# Patient Record
Sex: Female | Born: 1975 | Race: White | Hispanic: No | Marital: Single | Smoking: Never smoker
Health system: Southern US, Community
[De-identification: ages and names within clinical notes are randomized; demographics above are authoritative.]

## PROBLEM LIST (undated history)

## (undated) DIAGNOSIS — J302 Other seasonal allergic rhinitis: Secondary | ICD-10-CM

---

## 1997-12-27 ENCOUNTER — Emergency Department (HOSPITAL_COMMUNITY): Admission: EM | Admit: 1997-12-27 | Discharge: 1997-12-27 | Payer: Self-pay | Admitting: Family Medicine

## 1997-12-28 ENCOUNTER — Emergency Department (HOSPITAL_COMMUNITY): Admission: EM | Admit: 1997-12-28 | Discharge: 1997-12-28 | Payer: Self-pay | Admitting: Emergency Medicine

## 1998-05-12 ENCOUNTER — Emergency Department (HOSPITAL_COMMUNITY): Admission: EM | Admit: 1998-05-12 | Discharge: 1998-05-13 | Payer: Self-pay | Admitting: Emergency Medicine

## 2000-02-18 ENCOUNTER — Encounter: Payer: Self-pay | Admitting: *Deleted

## 2000-02-18 ENCOUNTER — Encounter: Admission: RE | Admit: 2000-02-18 | Discharge: 2000-02-18 | Payer: Self-pay | Admitting: *Deleted

## 2000-04-13 ENCOUNTER — Inpatient Hospital Stay (HOSPITAL_COMMUNITY): Admission: AD | Admit: 2000-04-13 | Discharge: 2000-04-13 | Payer: Self-pay | Admitting: Obstetrics

## 2000-04-21 ENCOUNTER — Encounter: Payer: Self-pay | Admitting: Emergency Medicine

## 2000-04-21 ENCOUNTER — Emergency Department (HOSPITAL_COMMUNITY): Admission: EM | Admit: 2000-04-21 | Discharge: 2000-04-21 | Payer: Self-pay | Admitting: Emergency Medicine

## 2000-04-28 ENCOUNTER — Emergency Department (HOSPITAL_COMMUNITY): Admission: EM | Admit: 2000-04-28 | Discharge: 2000-04-28 | Payer: Self-pay | Admitting: Emergency Medicine

## 2001-04-29 ENCOUNTER — Emergency Department (HOSPITAL_COMMUNITY): Admission: EM | Admit: 2001-04-29 | Discharge: 2001-04-29 | Payer: Self-pay | Admitting: Emergency Medicine

## 2001-05-20 ENCOUNTER — Encounter: Payer: Self-pay | Admitting: Emergency Medicine

## 2001-05-20 ENCOUNTER — Emergency Department (HOSPITAL_COMMUNITY): Admission: EM | Admit: 2001-05-20 | Discharge: 2001-05-20 | Payer: Self-pay | Admitting: Emergency Medicine

## 2001-09-15 ENCOUNTER — Emergency Department (HOSPITAL_COMMUNITY): Admission: EM | Admit: 2001-09-15 | Discharge: 2001-09-15 | Payer: Self-pay | Admitting: Emergency Medicine

## 2001-11-21 ENCOUNTER — Emergency Department (HOSPITAL_COMMUNITY): Admission: EM | Admit: 2001-11-21 | Discharge: 2001-11-21 | Payer: Self-pay | Admitting: Emergency Medicine

## 2001-11-21 ENCOUNTER — Encounter: Payer: Self-pay | Admitting: Emergency Medicine

## 2002-11-08 ENCOUNTER — Encounter: Payer: Self-pay | Admitting: Emergency Medicine

## 2002-11-08 ENCOUNTER — Emergency Department (HOSPITAL_COMMUNITY): Admission: EM | Admit: 2002-11-08 | Discharge: 2002-11-08 | Payer: Self-pay | Admitting: Emergency Medicine

## 2002-11-15 ENCOUNTER — Emergency Department (HOSPITAL_COMMUNITY): Admission: EM | Admit: 2002-11-15 | Discharge: 2002-11-15 | Payer: Self-pay | Admitting: Emergency Medicine

## 2003-07-04 ENCOUNTER — Emergency Department (HOSPITAL_COMMUNITY): Admission: EM | Admit: 2003-07-04 | Discharge: 2003-07-04 | Payer: Self-pay | Admitting: Emergency Medicine

## 2003-11-03 ENCOUNTER — Emergency Department (HOSPITAL_COMMUNITY): Admission: EM | Admit: 2003-11-03 | Discharge: 2003-11-03 | Payer: Self-pay | Admitting: Family Medicine

## 2003-11-14 ENCOUNTER — Emergency Department (HOSPITAL_COMMUNITY): Admission: EM | Admit: 2003-11-14 | Discharge: 2003-11-14 | Payer: Self-pay | Admitting: Emergency Medicine

## 2003-12-19 ENCOUNTER — Ambulatory Visit: Payer: Self-pay | Admitting: Internal Medicine

## 2003-12-21 ENCOUNTER — Emergency Department (HOSPITAL_COMMUNITY): Admission: EM | Admit: 2003-12-21 | Discharge: 2003-12-21 | Payer: Self-pay | Admitting: Emergency Medicine

## 2004-04-19 ENCOUNTER — Emergency Department (HOSPITAL_COMMUNITY): Admission: EM | Admit: 2004-04-19 | Discharge: 2004-04-19 | Payer: Self-pay | Admitting: Family Medicine

## 2004-04-22 ENCOUNTER — Emergency Department (HOSPITAL_COMMUNITY): Admission: EM | Admit: 2004-04-22 | Discharge: 2004-04-22 | Payer: Self-pay | Admitting: Emergency Medicine

## 2004-04-25 ENCOUNTER — Ambulatory Visit: Payer: Self-pay | Admitting: Internal Medicine

## 2004-04-26 ENCOUNTER — Emergency Department (HOSPITAL_COMMUNITY): Admission: EM | Admit: 2004-04-26 | Discharge: 2004-04-26 | Payer: Self-pay | Admitting: Emergency Medicine

## 2004-05-07 ENCOUNTER — Ambulatory Visit: Payer: Self-pay | Admitting: Internal Medicine

## 2004-05-19 ENCOUNTER — Emergency Department (HOSPITAL_COMMUNITY): Admission: EM | Admit: 2004-05-19 | Discharge: 2004-05-19 | Payer: Self-pay | Admitting: Emergency Medicine

## 2004-06-07 ENCOUNTER — Emergency Department (HOSPITAL_COMMUNITY): Admission: EM | Admit: 2004-06-07 | Discharge: 2004-06-07 | Payer: Self-pay | Admitting: Emergency Medicine

## 2004-06-13 ENCOUNTER — Emergency Department (HOSPITAL_COMMUNITY): Admission: EM | Admit: 2004-06-13 | Discharge: 2004-06-13 | Payer: Self-pay | Admitting: Emergency Medicine

## 2004-07-10 ENCOUNTER — Emergency Department (HOSPITAL_COMMUNITY): Admission: EM | Admit: 2004-07-10 | Discharge: 2004-07-10 | Payer: Self-pay | Admitting: Family Medicine

## 2004-08-16 ENCOUNTER — Emergency Department (HOSPITAL_COMMUNITY): Admission: EM | Admit: 2004-08-16 | Discharge: 2004-08-16 | Payer: Self-pay | Admitting: Emergency Medicine

## 2004-09-23 ENCOUNTER — Ambulatory Visit: Payer: Self-pay | Admitting: Internal Medicine

## 2004-10-10 ENCOUNTER — Emergency Department (HOSPITAL_COMMUNITY): Admission: EM | Admit: 2004-10-10 | Discharge: 2004-10-10 | Payer: Self-pay | Admitting: Emergency Medicine

## 2005-02-08 ENCOUNTER — Emergency Department (HOSPITAL_COMMUNITY): Admission: EM | Admit: 2005-02-08 | Discharge: 2005-02-08 | Payer: Self-pay | Admitting: Emergency Medicine

## 2005-02-25 ENCOUNTER — Emergency Department (HOSPITAL_COMMUNITY): Admission: EM | Admit: 2005-02-25 | Discharge: 2005-02-25 | Payer: Self-pay | Admitting: Emergency Medicine

## 2005-04-05 ENCOUNTER — Emergency Department (HOSPITAL_COMMUNITY): Admission: EM | Admit: 2005-04-05 | Discharge: 2005-04-05 | Payer: Self-pay | Admitting: Family Medicine

## 2005-05-14 ENCOUNTER — Emergency Department (HOSPITAL_COMMUNITY): Admission: EM | Admit: 2005-05-14 | Discharge: 2005-05-14 | Payer: Self-pay | Admitting: Family Medicine

## 2005-07-06 ENCOUNTER — Emergency Department (HOSPITAL_COMMUNITY): Admission: EM | Admit: 2005-07-06 | Discharge: 2005-07-06 | Payer: Self-pay | Admitting: Emergency Medicine

## 2005-07-13 ENCOUNTER — Emergency Department (HOSPITAL_COMMUNITY): Admission: EM | Admit: 2005-07-13 | Discharge: 2005-07-13 | Payer: Self-pay | Admitting: Family Medicine

## 2005-09-03 ENCOUNTER — Ambulatory Visit (HOSPITAL_COMMUNITY): Admission: RE | Admit: 2005-09-03 | Discharge: 2005-09-03 | Payer: Self-pay | Admitting: Family Medicine

## 2006-06-07 ENCOUNTER — Emergency Department (HOSPITAL_COMMUNITY): Admission: EM | Admit: 2006-06-07 | Discharge: 2006-06-07 | Payer: Self-pay | Admitting: Family Medicine

## 2006-07-09 ENCOUNTER — Emergency Department (HOSPITAL_COMMUNITY): Admission: EM | Admit: 2006-07-09 | Discharge: 2006-07-10 | Payer: Self-pay | Admitting: Emergency Medicine

## 2007-08-16 ENCOUNTER — Ambulatory Visit: Payer: Self-pay | Admitting: Psychiatry

## 2007-08-25 ENCOUNTER — Ambulatory Visit: Payer: Self-pay | Admitting: Psychiatry

## 2007-09-01 ENCOUNTER — Ambulatory Visit: Payer: Self-pay | Admitting: Psychiatry

## 2007-09-07 ENCOUNTER — Ambulatory Visit: Payer: Self-pay | Admitting: Psychiatry

## 2007-09-21 ENCOUNTER — Ambulatory Visit: Payer: Self-pay | Admitting: Psychiatry

## 2007-10-05 ENCOUNTER — Ambulatory Visit: Payer: Self-pay | Admitting: Psychiatry

## 2007-10-27 ENCOUNTER — Ambulatory Visit: Payer: Self-pay | Admitting: Psychiatry

## 2007-11-10 ENCOUNTER — Ambulatory Visit: Payer: Self-pay | Admitting: Psychiatry

## 2007-11-24 ENCOUNTER — Ambulatory Visit: Payer: Self-pay | Admitting: Psychiatry

## 2007-12-08 ENCOUNTER — Ambulatory Visit: Payer: Self-pay | Admitting: Psychiatry

## 2007-12-27 ENCOUNTER — Ambulatory Visit: Payer: Self-pay | Admitting: Psychiatry

## 2008-02-03 ENCOUNTER — Ambulatory Visit: Payer: Self-pay | Admitting: Psychiatry

## 2008-02-10 ENCOUNTER — Ambulatory Visit: Payer: Self-pay | Admitting: Psychiatry

## 2008-02-20 ENCOUNTER — Emergency Department (HOSPITAL_COMMUNITY): Admission: EM | Admit: 2008-02-20 | Discharge: 2008-02-20 | Payer: Self-pay | Admitting: Emergency Medicine

## 2008-02-21 ENCOUNTER — Ambulatory Visit: Payer: Self-pay | Admitting: Physician Assistant

## 2008-02-21 ENCOUNTER — Inpatient Hospital Stay (HOSPITAL_COMMUNITY): Admission: AD | Admit: 2008-02-21 | Discharge: 2008-02-21 | Payer: Self-pay | Admitting: Obstetrics & Gynecology

## 2008-02-22 ENCOUNTER — Ambulatory Visit: Payer: Self-pay | Admitting: Psychiatry

## 2008-02-29 ENCOUNTER — Ambulatory Visit: Payer: Self-pay | Admitting: Psychiatry

## 2008-03-07 ENCOUNTER — Ambulatory Visit: Payer: Self-pay | Admitting: Psychiatry

## 2008-03-25 ENCOUNTER — Emergency Department (HOSPITAL_COMMUNITY): Admission: EM | Admit: 2008-03-25 | Discharge: 2008-03-25 | Payer: Self-pay | Admitting: Family Medicine

## 2008-04-11 ENCOUNTER — Ambulatory Visit: Payer: Self-pay | Admitting: Psychiatry

## 2008-05-02 ENCOUNTER — Ambulatory Visit: Payer: Self-pay | Admitting: Psychiatry

## 2008-05-09 ENCOUNTER — Ambulatory Visit: Payer: Self-pay | Admitting: Psychiatry

## 2008-05-16 ENCOUNTER — Ambulatory Visit: Payer: Self-pay | Admitting: Psychiatry

## 2008-05-30 ENCOUNTER — Ambulatory Visit: Payer: Self-pay | Admitting: Psychiatry

## 2008-06-06 ENCOUNTER — Ambulatory Visit: Payer: Self-pay | Admitting: Psychiatry

## 2008-06-12 ENCOUNTER — Emergency Department (HOSPITAL_COMMUNITY): Admission: EM | Admit: 2008-06-12 | Discharge: 2008-06-12 | Payer: Self-pay | Admitting: Emergency Medicine

## 2008-06-13 ENCOUNTER — Ambulatory Visit: Payer: Self-pay | Admitting: Psychiatry

## 2008-06-19 ENCOUNTER — Ambulatory Visit: Payer: Self-pay | Admitting: Psychiatry

## 2008-06-27 ENCOUNTER — Ambulatory Visit: Payer: Self-pay | Admitting: Psychiatry

## 2008-07-04 ENCOUNTER — Ambulatory Visit: Payer: Self-pay | Admitting: Psychiatry

## 2008-07-25 ENCOUNTER — Ambulatory Visit: Payer: Self-pay | Admitting: Psychiatry

## 2008-08-01 ENCOUNTER — Ambulatory Visit: Payer: Self-pay | Admitting: Psychiatry

## 2008-08-08 ENCOUNTER — Ambulatory Visit: Payer: Self-pay | Admitting: Psychiatry

## 2008-08-15 ENCOUNTER — Ambulatory Visit: Payer: Self-pay | Admitting: Psychiatry

## 2008-08-22 ENCOUNTER — Ambulatory Visit: Payer: Self-pay | Admitting: Psychiatry

## 2008-08-29 ENCOUNTER — Ambulatory Visit: Payer: Self-pay | Admitting: Psychiatry

## 2008-09-26 ENCOUNTER — Ambulatory Visit: Payer: Self-pay | Admitting: Psychiatry

## 2008-10-03 ENCOUNTER — Ambulatory Visit: Payer: Self-pay | Admitting: Psychiatry

## 2008-10-07 IMAGING — CT CT ABD-PELV W/O CM
3 of 7 series · 13 of 42 positions shown, 19 images · IV contrast (CONTRAST)
Comparison: NONE

CLINICAL DATA: Chronic diarrhea times 2 months. 

CT ABDOMEN AND PELVIS WITHOUT AND WITH INTRAVENOUS AND FOLLOWING 
ORAL  CONTRAST
TECHNIQUE: Multiple axial 5-millimeter thick slices at 
5-millimeter intervals were obtained from the lung base through 
the pelvis following the intravenous administration of 98 cc of 
Optiray 350 at a rate of 3 cc per second.  Oral contrast was 
administered as well.  Arterial and venous phase imaging was 
obtained in the upper abdomen with delayed images obtained through 
the pelvis.

[Series 4: venous · axial · portal-venous · 0.63mm/px · z∈[+972,+1272]mm · 6 of 85 slices shown, 11 images]
[im 13/85  soft-tissue]
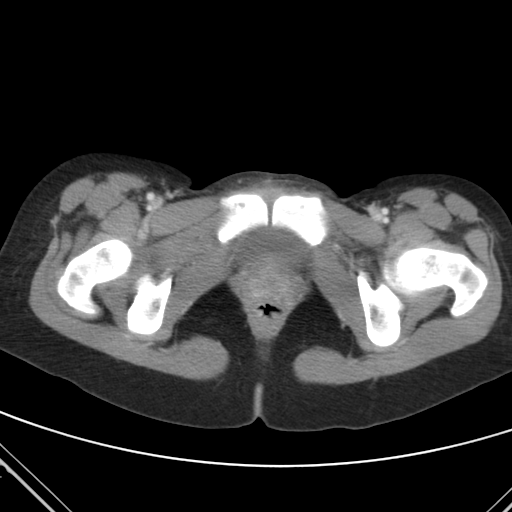
[im 13/85  bone]
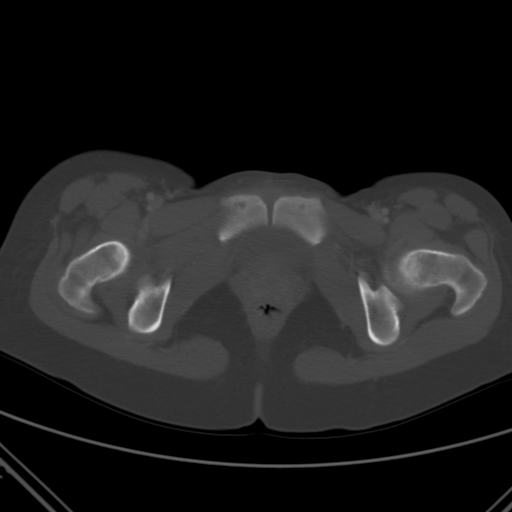
[im 25/85  soft-tissue]
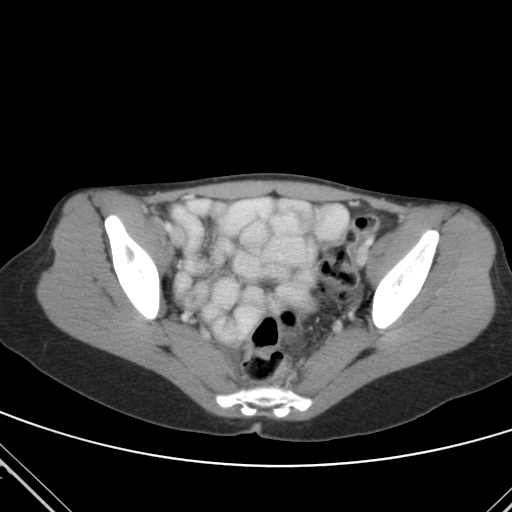
[im 37/85  soft-tissue]
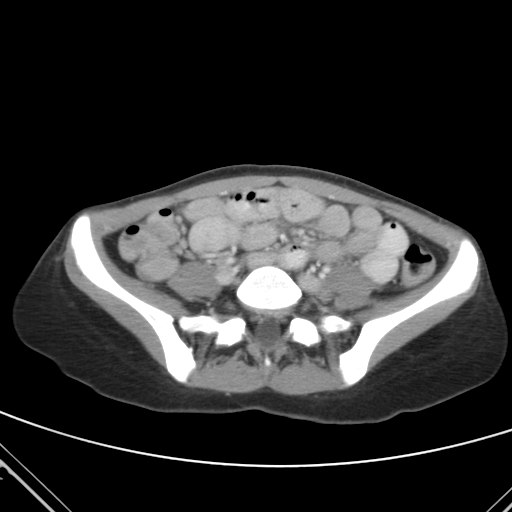
[im 37/85  lung]
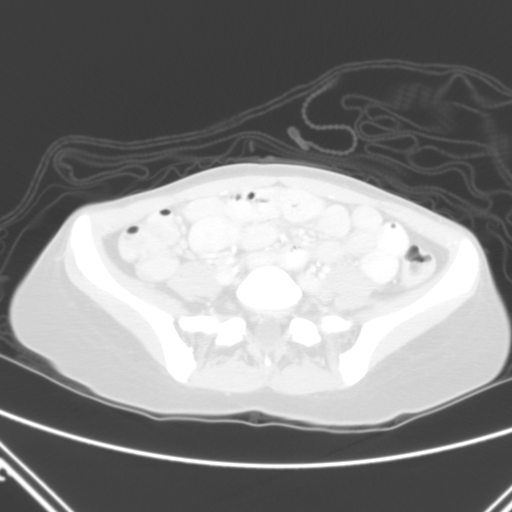
[im 49/85  soft-tissue]
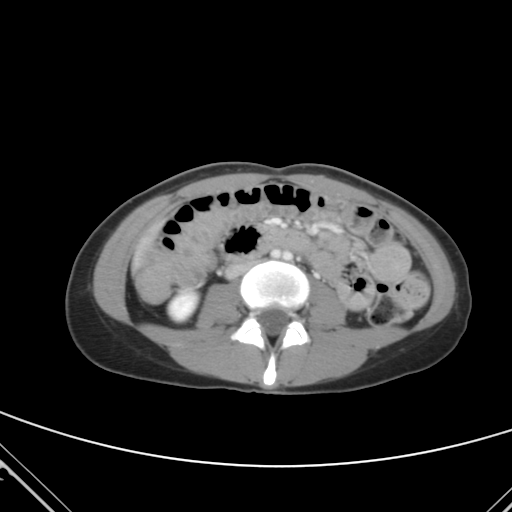
[im 49/85  lung]
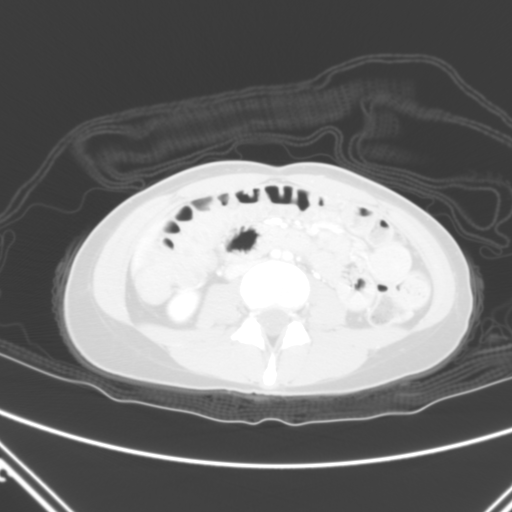
[im 61/85  soft-tissue]
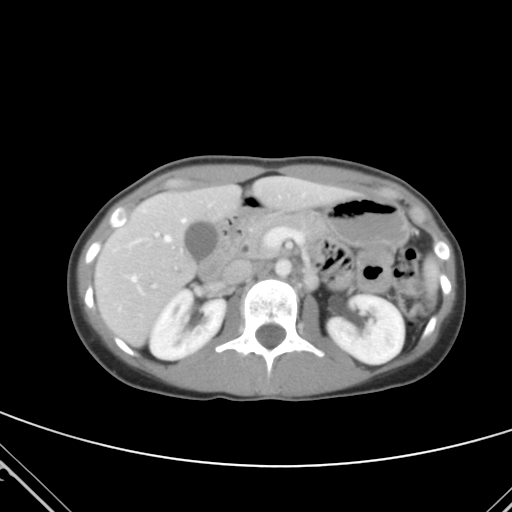
[im 61/85  lung]
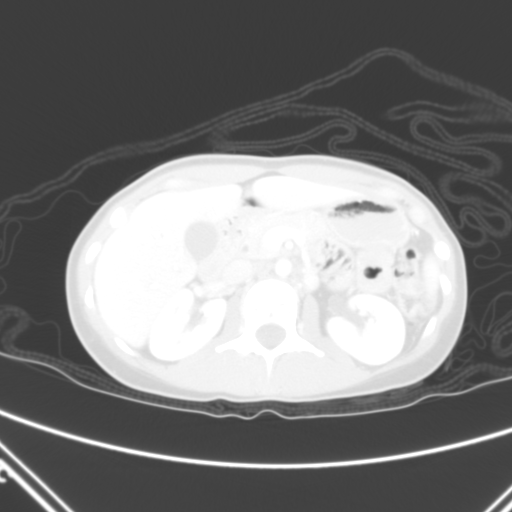
[im 73/85  soft-tissue]
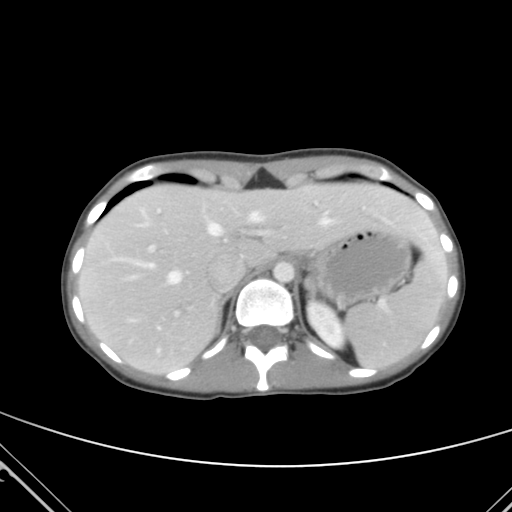
[im 73/85  lung]
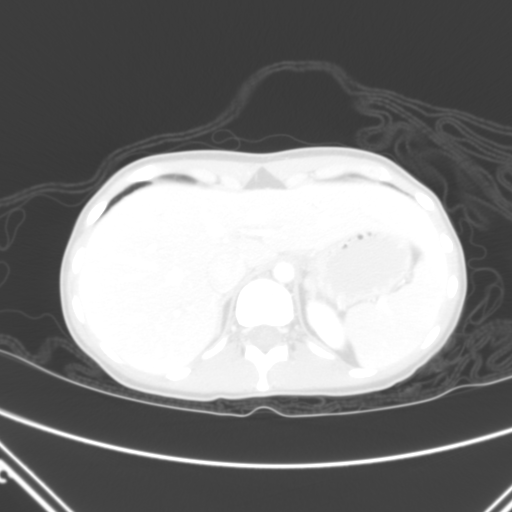

[Series 9: delays · axial · 0.66mm/px · z∈[+966,+1146]mm · 4 of 86 slices shown]
[im 13/86  soft-tissue]
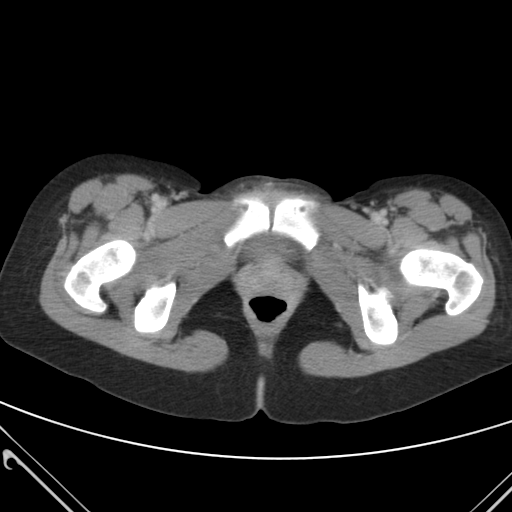
[im 25/86  soft-tissue]
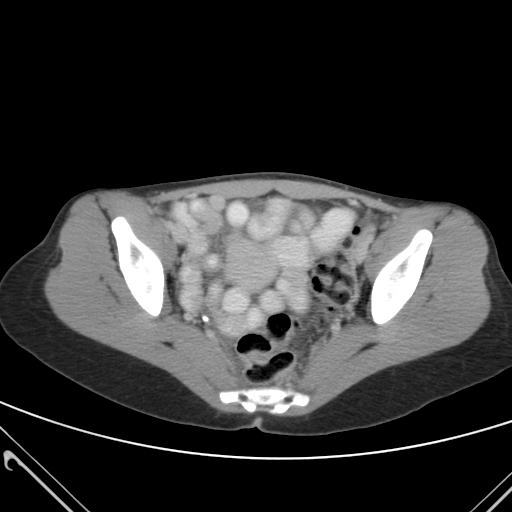
[im 37/86  soft-tissue]
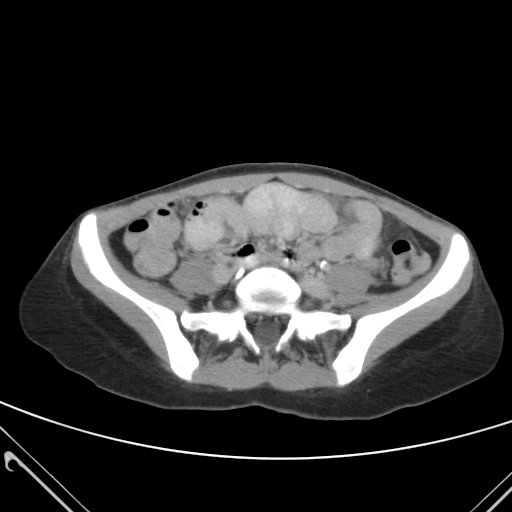
[im 49/86  soft-tissue]
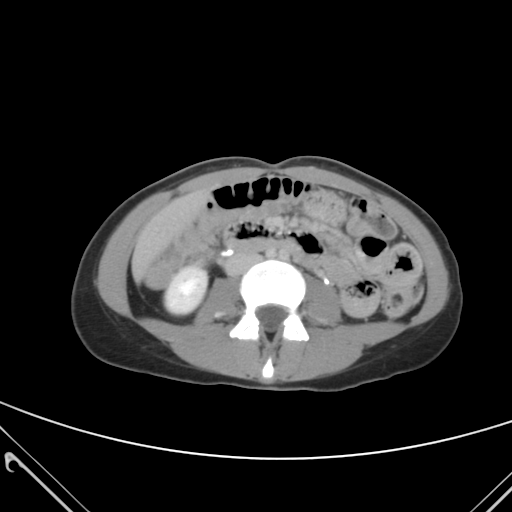

[Series 8058: coronals · coronal · 0.82mm/px · 3 of 49 slices shown, 4 images]
[im 17/49  soft-tissue]
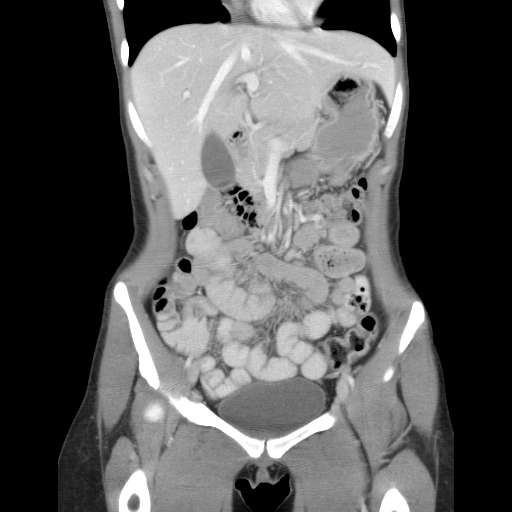
[im 22/49  soft-tissue]
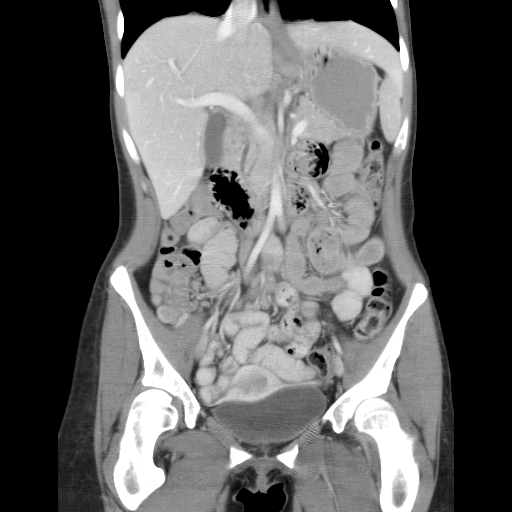
[im 22/49  bone]
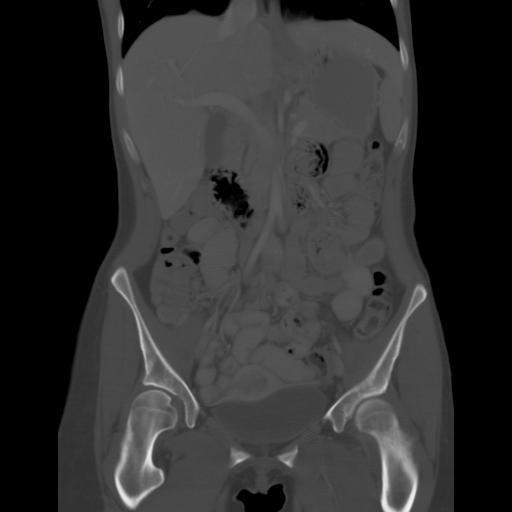
[im 27/49  soft-tissue]
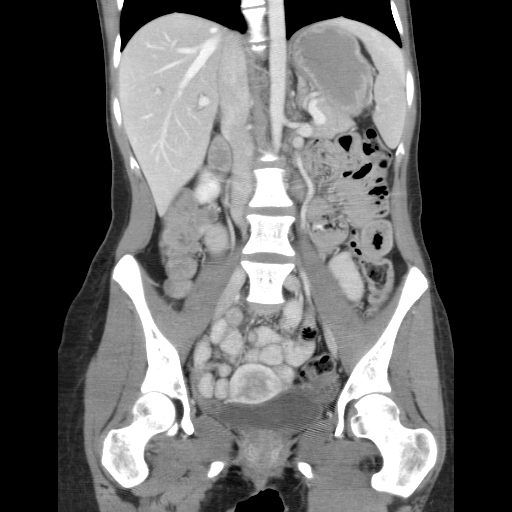

[13 of 42 positions shown; findings below may reference images not displayed]

FINDINGS: No gallstones are identified. No renal calculi are 
identified. The liver, gallbladder, and spleen are unremarkable.  
No focal or diffuse enlargement is noted of the pancreas.  There 
is no evidence of upper abdominal or retroperitoneal mass, 
adenopathy, or aneurysm. No adrenal or renal masses are noted.  
There is no evidence of obstructive uropathy. No bowel, 
mesenteric, pelvic, or inguinal mass, adenopathy, or inflammatory 
process. No evidence of appendicitis, diverticulitis, hernia, or 
bowel obstruction. No lung base mass, infiltrate, edema, or 
effusion. No lytic or blastic lesions are identified.
IMPRESSION: Negative CT of the abdomen and pelvis. Note is made 
of minimal free fluid in the cul-de-sac, most likely physiologic. 
Date: 07/10/2006  Tran Date: 07/10/2006 DAS  JLM

## 2008-11-08 ENCOUNTER — Ambulatory Visit: Payer: Self-pay | Admitting: Psychiatry

## 2008-11-13 ENCOUNTER — Inpatient Hospital Stay (HOSPITAL_COMMUNITY): Admission: AD | Admit: 2008-11-13 | Discharge: 2008-11-13 | Payer: Self-pay | Admitting: Obstetrics & Gynecology

## 2008-11-14 ENCOUNTER — Ambulatory Visit: Payer: Self-pay | Admitting: Psychiatry

## 2008-11-21 ENCOUNTER — Ambulatory Visit: Payer: Self-pay | Admitting: Psychiatry

## 2008-11-28 ENCOUNTER — Ambulatory Visit: Payer: Self-pay | Admitting: Psychiatry

## 2008-11-29 ENCOUNTER — Ambulatory Visit: Payer: Self-pay | Admitting: Psychiatry

## 2008-12-06 ENCOUNTER — Ambulatory Visit: Payer: Self-pay | Admitting: Obstetrics and Gynecology

## 2008-12-12 ENCOUNTER — Ambulatory Visit: Payer: Self-pay | Admitting: Psychiatry

## 2008-12-19 ENCOUNTER — Ambulatory Visit: Payer: Self-pay | Admitting: Psychiatry

## 2008-12-26 ENCOUNTER — Ambulatory Visit: Payer: Self-pay | Admitting: Psychiatry

## 2009-01-01 ENCOUNTER — Ambulatory Visit: Payer: Self-pay | Admitting: Psychiatry

## 2009-01-03 ENCOUNTER — Ambulatory Visit: Payer: Self-pay | Admitting: Obstetrics and Gynecology

## 2009-01-03 LAB — CONVERTED CEMR LAB
Chlamydia, Swab/Urine, PCR: NEGATIVE
GC Probe Amp, Urine: NEGATIVE
HCV Ab: NEGATIVE
Hepatitis B Surface Ag: NEGATIVE

## 2009-01-16 ENCOUNTER — Ambulatory Visit: Payer: Self-pay | Admitting: Psychiatry

## 2009-01-29 ENCOUNTER — Ambulatory Visit: Payer: Self-pay | Admitting: Psychiatry

## 2009-02-05 ENCOUNTER — Ambulatory Visit: Payer: Self-pay | Admitting: Psychiatry

## 2009-02-12 ENCOUNTER — Ambulatory Visit: Payer: Self-pay | Admitting: Psychiatry

## 2009-02-21 ENCOUNTER — Ambulatory Visit: Payer: Self-pay | Admitting: Psychiatry

## 2009-03-05 ENCOUNTER — Ambulatory Visit: Payer: Self-pay | Admitting: Psychiatry

## 2009-03-14 ENCOUNTER — Ambulatory Visit: Payer: Self-pay | Admitting: Psychiatry

## 2009-03-17 ENCOUNTER — Emergency Department (HOSPITAL_COMMUNITY): Admission: EM | Admit: 2009-03-17 | Discharge: 2009-03-17 | Payer: Self-pay | Admitting: Family Medicine

## 2009-03-19 ENCOUNTER — Ambulatory Visit: Payer: Self-pay | Admitting: Psychiatry

## 2009-04-02 ENCOUNTER — Ambulatory Visit: Payer: Self-pay | Admitting: Psychiatry

## 2009-04-09 ENCOUNTER — Ambulatory Visit: Payer: Self-pay | Admitting: Psychiatry

## 2009-04-18 ENCOUNTER — Ambulatory Visit: Payer: Self-pay | Admitting: Psychiatry

## 2009-04-25 ENCOUNTER — Ambulatory Visit: Payer: Self-pay | Admitting: Psychiatry

## 2009-04-30 ENCOUNTER — Ambulatory Visit: Payer: Self-pay | Admitting: Psychiatry

## 2009-05-08 ENCOUNTER — Ambulatory Visit: Payer: Self-pay | Admitting: Psychiatry

## 2009-05-10 ENCOUNTER — Emergency Department (HOSPITAL_COMMUNITY): Admission: EM | Admit: 2009-05-10 | Discharge: 2009-05-10 | Payer: Self-pay | Admitting: Emergency Medicine

## 2009-05-16 ENCOUNTER — Ambulatory Visit: Payer: Self-pay | Admitting: Psychiatry

## 2009-05-30 ENCOUNTER — Ambulatory Visit: Payer: Self-pay | Admitting: Psychiatry

## 2009-06-06 ENCOUNTER — Ambulatory Visit: Payer: Self-pay | Admitting: Obstetrics & Gynecology

## 2009-06-13 ENCOUNTER — Ambulatory Visit: Payer: Self-pay | Admitting: Psychiatry

## 2009-06-16 ENCOUNTER — Emergency Department (HOSPITAL_COMMUNITY): Admission: EM | Admit: 2009-06-16 | Discharge: 2009-06-16 | Payer: Self-pay | Admitting: Emergency Medicine

## 2009-06-25 ENCOUNTER — Ambulatory Visit: Payer: Self-pay | Admitting: Psychiatry

## 2009-07-02 ENCOUNTER — Ambulatory Visit: Payer: Self-pay | Admitting: Psychiatry

## 2009-07-09 ENCOUNTER — Ambulatory Visit: Payer: Self-pay | Admitting: Psychiatry

## 2009-07-23 ENCOUNTER — Ambulatory Visit: Payer: Self-pay | Admitting: Psychiatry

## 2009-07-30 ENCOUNTER — Ambulatory Visit: Payer: Self-pay | Admitting: Psychiatry

## 2009-08-06 ENCOUNTER — Ambulatory Visit: Payer: Self-pay | Admitting: Psychiatry

## 2009-08-14 ENCOUNTER — Ambulatory Visit: Payer: Self-pay | Admitting: Psychiatry

## 2009-08-20 ENCOUNTER — Ambulatory Visit: Payer: Self-pay | Admitting: Psychiatry

## 2009-08-27 ENCOUNTER — Ambulatory Visit: Payer: Self-pay | Admitting: Psychiatry

## 2009-09-03 ENCOUNTER — Ambulatory Visit: Payer: Self-pay | Admitting: Psychiatry

## 2009-09-10 ENCOUNTER — Ambulatory Visit: Payer: Self-pay | Admitting: Psychiatry

## 2009-09-12 ENCOUNTER — Ambulatory Visit: Payer: Self-pay | Admitting: Obstetrics & Gynecology

## 2009-09-12 LAB — CONVERTED CEMR LAB
Estradiol: 118.7 pg/mL
FSH: 6.9 milliintl units/mL
TSH: 1.126 microintl units/mL (ref 0.350–4.500)
Testosterone: 30.05 ng/dL (ref 10–70)

## 2009-09-24 ENCOUNTER — Ambulatory Visit: Payer: Self-pay | Admitting: Psychiatry

## 2009-10-01 ENCOUNTER — Ambulatory Visit: Payer: Self-pay | Admitting: Psychiatry

## 2009-10-03 ENCOUNTER — Other Ambulatory Visit: Admission: RE | Admit: 2009-10-03 | Discharge: 2009-10-03 | Payer: Self-pay | Admitting: Obstetrics & Gynecology

## 2009-10-03 ENCOUNTER — Ambulatory Visit: Payer: Self-pay | Admitting: Obstetrics & Gynecology

## 2009-10-07 ENCOUNTER — Observation Stay (HOSPITAL_COMMUNITY): Admission: EM | Admit: 2009-10-07 | Discharge: 2009-10-07 | Payer: Self-pay | Admitting: Emergency Medicine

## 2009-10-08 ENCOUNTER — Ambulatory Visit: Payer: Self-pay | Admitting: Psychiatry

## 2009-10-15 ENCOUNTER — Ambulatory Visit: Payer: Self-pay | Admitting: Psychiatry

## 2009-10-22 ENCOUNTER — Ambulatory Visit: Payer: Self-pay | Admitting: Psychiatry

## 2009-11-05 ENCOUNTER — Ambulatory Visit: Payer: Self-pay | Admitting: Psychiatry

## 2009-11-12 ENCOUNTER — Ambulatory Visit: Payer: Self-pay | Admitting: Psychiatry

## 2009-11-19 ENCOUNTER — Ambulatory Visit: Payer: Self-pay | Admitting: Psychiatry

## 2009-11-26 ENCOUNTER — Ambulatory Visit: Payer: Self-pay | Admitting: Psychiatry

## 2009-12-03 ENCOUNTER — Ambulatory Visit: Payer: Self-pay | Admitting: Psychiatry

## 2009-12-10 ENCOUNTER — Ambulatory Visit: Payer: Self-pay | Admitting: Psychiatry

## 2009-12-18 ENCOUNTER — Ambulatory Visit: Payer: Self-pay | Admitting: Psychiatry

## 2009-12-24 ENCOUNTER — Ambulatory Visit: Payer: Self-pay | Admitting: Psychiatry

## 2009-12-31 ENCOUNTER — Ambulatory Visit: Payer: Self-pay | Admitting: Psychiatry

## 2010-01-15 ENCOUNTER — Ambulatory Visit
Admission: RE | Admit: 2010-01-15 | Discharge: 2010-01-15 | Payer: Self-pay | Source: Home / Self Care | Attending: Psychiatry | Admitting: Psychiatry

## 2010-01-16 ENCOUNTER — Ambulatory Visit
Admission: RE | Admit: 2010-01-16 | Discharge: 2010-01-16 | Payer: Self-pay | Source: Home / Self Care | Attending: Obstetrics & Gynecology | Admitting: Obstetrics & Gynecology

## 2010-01-21 ENCOUNTER — Ambulatory Visit: Admit: 2010-01-21 | Payer: Self-pay | Admitting: Psychiatry

## 2010-01-22 ENCOUNTER — Ambulatory Visit
Admission: RE | Admit: 2010-01-22 | Discharge: 2010-01-22 | Payer: Self-pay | Source: Home / Self Care | Attending: Psychiatry | Admitting: Psychiatry

## 2010-01-28 ENCOUNTER — Ambulatory Visit: Admit: 2010-01-28 | Payer: Self-pay | Admitting: Psychiatry

## 2010-01-29 ENCOUNTER — Ambulatory Visit
Admission: RE | Admit: 2010-01-29 | Discharge: 2010-01-29 | Payer: Self-pay | Source: Home / Self Care | Attending: Psychiatry | Admitting: Psychiatry

## 2010-02-05 ENCOUNTER — Ambulatory Visit: Admit: 2010-02-05 | Payer: Self-pay | Admitting: Psychiatry

## 2010-02-12 ENCOUNTER — Ambulatory Visit: Admit: 2010-02-12 | Payer: Self-pay | Admitting: Psychiatry

## 2010-02-19 ENCOUNTER — Ambulatory Visit (INDEPENDENT_AMBULATORY_CARE_PROVIDER_SITE_OTHER): Payer: PRIVATE HEALTH INSURANCE | Admitting: Psychiatry

## 2010-02-19 DIAGNOSIS — F4310 Post-traumatic stress disorder, unspecified: Secondary | ICD-10-CM

## 2010-02-19 DIAGNOSIS — F988 Other specified behavioral and emotional disorders with onset usually occurring in childhood and adolescence: Secondary | ICD-10-CM

## 2010-02-26 ENCOUNTER — Ambulatory Visit (INDEPENDENT_AMBULATORY_CARE_PROVIDER_SITE_OTHER): Payer: PRIVATE HEALTH INSURANCE | Admitting: Psychiatry

## 2010-02-26 DIAGNOSIS — F988 Other specified behavioral and emotional disorders with onset usually occurring in childhood and adolescence: Secondary | ICD-10-CM

## 2010-02-26 DIAGNOSIS — F4310 Post-traumatic stress disorder, unspecified: Secondary | ICD-10-CM

## 2010-03-05 ENCOUNTER — Ambulatory Visit: Payer: PRIVATE HEALTH INSURANCE | Admitting: Psychiatry

## 2010-03-12 ENCOUNTER — Ambulatory Visit (INDEPENDENT_AMBULATORY_CARE_PROVIDER_SITE_OTHER): Payer: PRIVATE HEALTH INSURANCE | Admitting: Psychiatry

## 2010-03-12 DIAGNOSIS — F6389 Other impulse disorders: Secondary | ICD-10-CM

## 2010-03-12 DIAGNOSIS — F4310 Post-traumatic stress disorder, unspecified: Secondary | ICD-10-CM

## 2010-03-12 DIAGNOSIS — F988 Other specified behavioral and emotional disorders with onset usually occurring in childhood and adolescence: Secondary | ICD-10-CM

## 2010-03-28 LAB — WET PREP, GENITAL
Clue Cells Wet Prep HPF POC: NONE SEEN
Trich, Wet Prep: NONE SEEN
WBC, Wet Prep HPF POC: NONE SEEN
Yeast Wet Prep HPF POC: NONE SEEN

## 2010-03-28 LAB — POCT I-STAT, CHEM 8
BUN: 18 mg/dL (ref 6–23)
Calcium, Ion: 1.15 mmol/L (ref 1.12–1.32)
Chloride: 106 mEq/L (ref 96–112)
Creatinine, Ser: 0.9 mg/dL (ref 0.4–1.2)
Glucose, Bld: 86 mg/dL (ref 70–99)
HCT: 45 % (ref 36.0–46.0)
Hemoglobin: 15.3 g/dL — ABNORMAL HIGH (ref 12.0–15.0)
Potassium: 4 mEq/L (ref 3.5–5.1)
Sodium: 137 mEq/L (ref 135–145)
TCO2: 21 mmol/L (ref 0–100)

## 2010-03-28 LAB — DIFFERENTIAL
Basophils Absolute: 0 10*3/uL (ref 0.0–0.1)
Basophils Relative: 0 % (ref 0–1)
Eosinophils Absolute: 0 10*3/uL (ref 0.0–0.7)
Eosinophils Relative: 0 % (ref 0–5)
Lymphocytes Relative: 11 % — ABNORMAL LOW (ref 12–46)
Lymphs Abs: 1 10*3/uL (ref 0.7–4.0)
Monocytes Absolute: 0.4 10*3/uL (ref 0.1–1.0)
Monocytes Relative: 4 % (ref 3–12)
Neutro Abs: 8 10*3/uL — ABNORMAL HIGH (ref 1.7–7.7)
Neutrophils Relative %: 85 % — ABNORMAL HIGH (ref 43–77)

## 2010-03-28 LAB — CBC
HCT: 40.2 % (ref 36.0–46.0)
Hemoglobin: 13.8 g/dL (ref 12.0–15.0)
MCH: 29.5 pg (ref 26.0–34.0)
MCHC: 34.3 g/dL (ref 30.0–36.0)
MCV: 85.9 fL (ref 78.0–100.0)
Platelets: 276 10*3/uL (ref 150–400)
RBC: 4.68 MIL/uL (ref 3.87–5.11)
RDW: 12.6 % (ref 11.5–15.5)
WBC: 9.5 10*3/uL (ref 4.0–10.5)

## 2010-03-28 LAB — URINALYSIS, ROUTINE W REFLEX MICROSCOPIC
Glucose, UA: NEGATIVE mg/dL
Hgb urine dipstick: NEGATIVE
Ketones, ur: >80 mg/dL — AB
Nitrite: NEGATIVE
Protein, ur: NEGATIVE mg/dL
Specific Gravity, Urine: 1.027 (ref 1.005–1.030)
Urobilinogen, UA: 0.2 mg/dL (ref 0.0–1.0)
pH: 5.5 (ref 5.0–8.0)

## 2010-03-28 LAB — GLUCOSE, CAPILLARY: Glucose-Capillary: 88 mg/dL (ref 70–99)

## 2010-03-28 LAB — GC/CHLAMYDIA PROBE AMP, GENITAL
Chlamydia, DNA Probe: NEGATIVE
GC Probe Amp, Genital: NEGATIVE

## 2010-03-28 LAB — POCT PREGNANCY, URINE: Preg Test, Ur: NEGATIVE

## 2010-04-02 ENCOUNTER — Ambulatory Visit (INDEPENDENT_AMBULATORY_CARE_PROVIDER_SITE_OTHER): Payer: PRIVATE HEALTH INSURANCE | Admitting: Psychiatry

## 2010-04-02 DIAGNOSIS — F988 Other specified behavioral and emotional disorders with onset usually occurring in childhood and adolescence: Secondary | ICD-10-CM

## 2010-04-02 DIAGNOSIS — F4310 Post-traumatic stress disorder, unspecified: Secondary | ICD-10-CM

## 2010-04-02 LAB — URINALYSIS, ROUTINE W REFLEX MICROSCOPIC
Bilirubin Urine: NEGATIVE
Glucose, UA: NEGATIVE mg/dL
Ketones, ur: 40 mg/dL — AB
Nitrite: NEGATIVE
Protein, ur: NEGATIVE mg/dL
Specific Gravity, Urine: 1.017 (ref 1.005–1.030)
Urobilinogen, UA: 0.2 mg/dL (ref 0.0–1.0)
pH: 7.5 (ref 5.0–8.0)

## 2010-04-02 LAB — URINE MICROSCOPIC-ADD ON

## 2010-04-02 LAB — POCT I-STAT, CHEM 8
BUN: 6 mg/dL (ref 6–23)
Calcium, Ion: 1.13 mmol/L (ref 1.12–1.32)
Chloride: 107 mEq/L (ref 96–112)
Creatinine, Ser: 0.8 mg/dL (ref 0.4–1.2)
Glucose, Bld: 87 mg/dL (ref 70–99)
HCT: 40 % (ref 36.0–46.0)
Hemoglobin: 13.6 g/dL (ref 12.0–15.0)
Potassium: 3.9 mEq/L (ref 3.5–5.1)
Sodium: 140 mEq/L (ref 135–145)
TCO2: 23 mmol/L (ref 0–100)

## 2010-04-02 LAB — POCT PREGNANCY, URINE: Preg Test, Ur: NEGATIVE

## 2010-04-09 ENCOUNTER — Ambulatory Visit (INDEPENDENT_AMBULATORY_CARE_PROVIDER_SITE_OTHER): Payer: PRIVATE HEALTH INSURANCE | Admitting: Psychiatry

## 2010-04-09 DIAGNOSIS — F988 Other specified behavioral and emotional disorders with onset usually occurring in childhood and adolescence: Secondary | ICD-10-CM

## 2010-04-09 DIAGNOSIS — F4310 Post-traumatic stress disorder, unspecified: Secondary | ICD-10-CM

## 2010-04-16 ENCOUNTER — Ambulatory Visit (INDEPENDENT_AMBULATORY_CARE_PROVIDER_SITE_OTHER): Payer: PRIVATE HEALTH INSURANCE | Admitting: Psychiatry

## 2010-04-16 DIAGNOSIS — F6389 Other impulse disorders: Secondary | ICD-10-CM

## 2010-04-16 DIAGNOSIS — F4310 Post-traumatic stress disorder, unspecified: Secondary | ICD-10-CM

## 2010-04-16 DIAGNOSIS — F988 Other specified behavioral and emotional disorders with onset usually occurring in childhood and adolescence: Secondary | ICD-10-CM

## 2010-04-17 LAB — URINALYSIS, ROUTINE W REFLEX MICROSCOPIC
Bilirubin Urine: NEGATIVE
Glucose, UA: NEGATIVE mg/dL
Hgb urine dipstick: NEGATIVE
Ketones, ur: NEGATIVE mg/dL
Nitrite: NEGATIVE
Protein, ur: NEGATIVE mg/dL
Specific Gravity, Urine: 1.025 (ref 1.005–1.030)
Urobilinogen, UA: 0.2 mg/dL (ref 0.0–1.0)
pH: 5.5 (ref 5.0–8.0)

## 2010-04-17 LAB — POCT PREGNANCY, URINE: Preg Test, Ur: NEGATIVE

## 2010-04-23 ENCOUNTER — Ambulatory Visit (INDEPENDENT_AMBULATORY_CARE_PROVIDER_SITE_OTHER): Payer: PRIVATE HEALTH INSURANCE | Admitting: Psychiatry

## 2010-04-23 DIAGNOSIS — F4310 Post-traumatic stress disorder, unspecified: Secondary | ICD-10-CM

## 2010-04-23 DIAGNOSIS — F988 Other specified behavioral and emotional disorders with onset usually occurring in childhood and adolescence: Secondary | ICD-10-CM

## 2010-04-25 LAB — POCT I-STAT, CHEM 8
BUN: 12 mg/dL (ref 6–23)
Calcium, Ion: 1.19 mmol/L (ref 1.12–1.32)
Chloride: 103 mEq/L (ref 96–112)
Creatinine, Ser: 0.9 mg/dL (ref 0.4–1.2)
Glucose, Bld: 84 mg/dL (ref 70–99)
HCT: 43 % (ref 36.0–46.0)
Hemoglobin: 14.6 g/dL (ref 12.0–15.0)
Potassium: 3.8 mEq/L (ref 3.5–5.1)
Sodium: 139 mEq/L (ref 135–145)
TCO2: 27 mmol/L (ref 0–100)

## 2010-04-25 LAB — POCT PREGNANCY, URINE: Preg Test, Ur: NEGATIVE

## 2010-04-30 ENCOUNTER — Ambulatory Visit (INDEPENDENT_AMBULATORY_CARE_PROVIDER_SITE_OTHER): Payer: PRIVATE HEALTH INSURANCE | Admitting: Psychiatry

## 2010-04-30 DIAGNOSIS — F988 Other specified behavioral and emotional disorders with onset usually occurring in childhood and adolescence: Secondary | ICD-10-CM

## 2010-04-30 DIAGNOSIS — F6389 Other impulse disorders: Secondary | ICD-10-CM

## 2010-04-30 DIAGNOSIS — F4310 Post-traumatic stress disorder, unspecified: Secondary | ICD-10-CM

## 2010-05-07 ENCOUNTER — Ambulatory Visit (INDEPENDENT_AMBULATORY_CARE_PROVIDER_SITE_OTHER): Payer: PRIVATE HEALTH INSURANCE | Admitting: Psychiatry

## 2010-05-07 DIAGNOSIS — F4310 Post-traumatic stress disorder, unspecified: Secondary | ICD-10-CM

## 2010-05-07 DIAGNOSIS — F988 Other specified behavioral and emotional disorders with onset usually occurring in childhood and adolescence: Secondary | ICD-10-CM

## 2010-05-07 DIAGNOSIS — F6389 Other impulse disorders: Secondary | ICD-10-CM

## 2010-05-14 ENCOUNTER — Ambulatory Visit (INDEPENDENT_AMBULATORY_CARE_PROVIDER_SITE_OTHER): Payer: PRIVATE HEALTH INSURANCE | Admitting: Psychiatry

## 2010-05-14 DIAGNOSIS — F988 Other specified behavioral and emotional disorders with onset usually occurring in childhood and adolescence: Secondary | ICD-10-CM

## 2010-05-14 DIAGNOSIS — F4310 Post-traumatic stress disorder, unspecified: Secondary | ICD-10-CM

## 2010-05-14 DIAGNOSIS — F6389 Other impulse disorders: Secondary | ICD-10-CM

## 2010-05-21 ENCOUNTER — Ambulatory Visit (INDEPENDENT_AMBULATORY_CARE_PROVIDER_SITE_OTHER): Payer: PRIVATE HEALTH INSURANCE | Admitting: Psychiatry

## 2010-05-21 DIAGNOSIS — F6389 Other impulse disorders: Secondary | ICD-10-CM

## 2010-05-21 DIAGNOSIS — F988 Other specified behavioral and emotional disorders with onset usually occurring in childhood and adolescence: Secondary | ICD-10-CM

## 2010-05-21 DIAGNOSIS — F4310 Post-traumatic stress disorder, unspecified: Secondary | ICD-10-CM

## 2010-05-28 ENCOUNTER — Ambulatory Visit (INDEPENDENT_AMBULATORY_CARE_PROVIDER_SITE_OTHER): Payer: PRIVATE HEALTH INSURANCE | Admitting: Psychiatry

## 2010-05-28 DIAGNOSIS — F4310 Post-traumatic stress disorder, unspecified: Secondary | ICD-10-CM

## 2010-05-28 DIAGNOSIS — F6389 Other impulse disorders: Secondary | ICD-10-CM

## 2010-05-28 DIAGNOSIS — F988 Other specified behavioral and emotional disorders with onset usually occurring in childhood and adolescence: Secondary | ICD-10-CM

## 2010-06-04 ENCOUNTER — Ambulatory Visit (INDEPENDENT_AMBULATORY_CARE_PROVIDER_SITE_OTHER): Payer: PRIVATE HEALTH INSURANCE | Admitting: Psychiatry

## 2010-06-04 DIAGNOSIS — F988 Other specified behavioral and emotional disorders with onset usually occurring in childhood and adolescence: Secondary | ICD-10-CM

## 2010-06-04 DIAGNOSIS — F4310 Post-traumatic stress disorder, unspecified: Secondary | ICD-10-CM

## 2010-06-04 DIAGNOSIS — F6389 Other impulse disorders: Secondary | ICD-10-CM

## 2010-06-11 ENCOUNTER — Ambulatory Visit (INDEPENDENT_AMBULATORY_CARE_PROVIDER_SITE_OTHER): Payer: PRIVATE HEALTH INSURANCE | Admitting: Psychiatry

## 2010-06-11 DIAGNOSIS — F988 Other specified behavioral and emotional disorders with onset usually occurring in childhood and adolescence: Secondary | ICD-10-CM

## 2010-06-11 DIAGNOSIS — F6389 Other impulse disorders: Secondary | ICD-10-CM

## 2010-06-11 DIAGNOSIS — F4310 Post-traumatic stress disorder, unspecified: Secondary | ICD-10-CM

## 2010-06-18 ENCOUNTER — Ambulatory Visit (INDEPENDENT_AMBULATORY_CARE_PROVIDER_SITE_OTHER): Payer: PRIVATE HEALTH INSURANCE | Admitting: Psychiatry

## 2010-06-18 DIAGNOSIS — F6389 Other impulse disorders: Secondary | ICD-10-CM

## 2010-06-18 DIAGNOSIS — F988 Other specified behavioral and emotional disorders with onset usually occurring in childhood and adolescence: Secondary | ICD-10-CM

## 2010-06-18 DIAGNOSIS — F4310 Post-traumatic stress disorder, unspecified: Secondary | ICD-10-CM

## 2010-06-25 ENCOUNTER — Ambulatory Visit (INDEPENDENT_AMBULATORY_CARE_PROVIDER_SITE_OTHER): Payer: PRIVATE HEALTH INSURANCE | Admitting: Psychiatry

## 2010-06-25 DIAGNOSIS — F4310 Post-traumatic stress disorder, unspecified: Secondary | ICD-10-CM

## 2010-06-25 DIAGNOSIS — F988 Other specified behavioral and emotional disorders with onset usually occurring in childhood and adolescence: Secondary | ICD-10-CM

## 2010-06-25 DIAGNOSIS — F6389 Other impulse disorders: Secondary | ICD-10-CM

## 2010-07-02 ENCOUNTER — Ambulatory Visit (INDEPENDENT_AMBULATORY_CARE_PROVIDER_SITE_OTHER): Payer: PRIVATE HEALTH INSURANCE | Admitting: Psychiatry

## 2010-07-02 DIAGNOSIS — F4310 Post-traumatic stress disorder, unspecified: Secondary | ICD-10-CM

## 2010-07-02 DIAGNOSIS — F988 Other specified behavioral and emotional disorders with onset usually occurring in childhood and adolescence: Secondary | ICD-10-CM

## 2010-07-02 DIAGNOSIS — F6389 Other impulse disorders: Secondary | ICD-10-CM

## 2010-07-16 ENCOUNTER — Ambulatory Visit (INDEPENDENT_AMBULATORY_CARE_PROVIDER_SITE_OTHER): Payer: PRIVATE HEALTH INSURANCE | Admitting: Psychiatry

## 2010-07-16 DIAGNOSIS — F4310 Post-traumatic stress disorder, unspecified: Secondary | ICD-10-CM

## 2010-07-16 DIAGNOSIS — F6389 Other impulse disorders: Secondary | ICD-10-CM

## 2010-07-16 DIAGNOSIS — F988 Other specified behavioral and emotional disorders with onset usually occurring in childhood and adolescence: Secondary | ICD-10-CM

## 2010-07-23 ENCOUNTER — Ambulatory Visit (INDEPENDENT_AMBULATORY_CARE_PROVIDER_SITE_OTHER): Payer: PRIVATE HEALTH INSURANCE | Admitting: Psychiatry

## 2010-07-23 DIAGNOSIS — F988 Other specified behavioral and emotional disorders with onset usually occurring in childhood and adolescence: Secondary | ICD-10-CM

## 2010-07-23 DIAGNOSIS — F6389 Other impulse disorders: Secondary | ICD-10-CM

## 2010-07-23 DIAGNOSIS — F4310 Post-traumatic stress disorder, unspecified: Secondary | ICD-10-CM

## 2010-07-30 ENCOUNTER — Ambulatory Visit (INDEPENDENT_AMBULATORY_CARE_PROVIDER_SITE_OTHER): Payer: PRIVATE HEALTH INSURANCE | Admitting: Psychiatry

## 2010-07-30 DIAGNOSIS — F6389 Other impulse disorders: Secondary | ICD-10-CM

## 2010-07-30 DIAGNOSIS — F988 Other specified behavioral and emotional disorders with onset usually occurring in childhood and adolescence: Secondary | ICD-10-CM

## 2010-07-30 DIAGNOSIS — F4310 Post-traumatic stress disorder, unspecified: Secondary | ICD-10-CM

## 2010-08-06 ENCOUNTER — Ambulatory Visit (INDEPENDENT_AMBULATORY_CARE_PROVIDER_SITE_OTHER): Payer: PRIVATE HEALTH INSURANCE | Admitting: Psychiatry

## 2010-08-06 DIAGNOSIS — F6389 Other impulse disorders: Secondary | ICD-10-CM

## 2010-08-06 DIAGNOSIS — F988 Other specified behavioral and emotional disorders with onset usually occurring in childhood and adolescence: Secondary | ICD-10-CM

## 2010-08-06 DIAGNOSIS — F4310 Post-traumatic stress disorder, unspecified: Secondary | ICD-10-CM

## 2010-08-13 ENCOUNTER — Ambulatory Visit (INDEPENDENT_AMBULATORY_CARE_PROVIDER_SITE_OTHER): Payer: PRIVATE HEALTH INSURANCE | Admitting: Psychiatry

## 2010-08-13 DIAGNOSIS — F988 Other specified behavioral and emotional disorders with onset usually occurring in childhood and adolescence: Secondary | ICD-10-CM

## 2010-08-13 DIAGNOSIS — F6389 Other impulse disorders: Secondary | ICD-10-CM

## 2010-08-13 DIAGNOSIS — F4310 Post-traumatic stress disorder, unspecified: Secondary | ICD-10-CM

## 2010-08-20 ENCOUNTER — Ambulatory Visit: Payer: PRIVATE HEALTH INSURANCE | Admitting: Psychiatry

## 2010-08-27 ENCOUNTER — Ambulatory Visit (INDEPENDENT_AMBULATORY_CARE_PROVIDER_SITE_OTHER): Payer: PRIVATE HEALTH INSURANCE | Admitting: Psychiatry

## 2010-08-27 DIAGNOSIS — F988 Other specified behavioral and emotional disorders with onset usually occurring in childhood and adolescence: Secondary | ICD-10-CM

## 2010-08-27 DIAGNOSIS — F6389 Other impulse disorders: Secondary | ICD-10-CM

## 2010-08-27 DIAGNOSIS — F4310 Post-traumatic stress disorder, unspecified: Secondary | ICD-10-CM

## 2010-09-03 ENCOUNTER — Ambulatory Visit (INDEPENDENT_AMBULATORY_CARE_PROVIDER_SITE_OTHER): Payer: PRIVATE HEALTH INSURANCE | Admitting: Psychiatry

## 2010-09-03 DIAGNOSIS — F6389 Other impulse disorders: Secondary | ICD-10-CM

## 2010-09-03 DIAGNOSIS — F988 Other specified behavioral and emotional disorders with onset usually occurring in childhood and adolescence: Secondary | ICD-10-CM

## 2010-09-03 DIAGNOSIS — F4310 Post-traumatic stress disorder, unspecified: Secondary | ICD-10-CM

## 2010-09-10 ENCOUNTER — Ambulatory Visit (INDEPENDENT_AMBULATORY_CARE_PROVIDER_SITE_OTHER): Payer: PRIVATE HEALTH INSURANCE | Admitting: Psychiatry

## 2010-09-10 DIAGNOSIS — F988 Other specified behavioral and emotional disorders with onset usually occurring in childhood and adolescence: Secondary | ICD-10-CM

## 2010-09-10 DIAGNOSIS — F6389 Other impulse disorders: Secondary | ICD-10-CM

## 2010-09-10 DIAGNOSIS — F4310 Post-traumatic stress disorder, unspecified: Secondary | ICD-10-CM

## 2010-09-17 ENCOUNTER — Ambulatory Visit (INDEPENDENT_AMBULATORY_CARE_PROVIDER_SITE_OTHER): Payer: PRIVATE HEALTH INSURANCE | Admitting: Psychiatry

## 2010-09-17 DIAGNOSIS — F6389 Other impulse disorders: Secondary | ICD-10-CM

## 2010-09-17 DIAGNOSIS — F4310 Post-traumatic stress disorder, unspecified: Secondary | ICD-10-CM

## 2010-09-17 DIAGNOSIS — F909 Attention-deficit hyperactivity disorder, unspecified type: Secondary | ICD-10-CM

## 2010-09-24 ENCOUNTER — Ambulatory Visit (INDEPENDENT_AMBULATORY_CARE_PROVIDER_SITE_OTHER): Payer: PRIVATE HEALTH INSURANCE | Admitting: Psychiatry

## 2010-09-24 DIAGNOSIS — F988 Other specified behavioral and emotional disorders with onset usually occurring in childhood and adolescence: Secondary | ICD-10-CM

## 2010-09-24 DIAGNOSIS — F6389 Other impulse disorders: Secondary | ICD-10-CM

## 2010-09-24 DIAGNOSIS — F4310 Post-traumatic stress disorder, unspecified: Secondary | ICD-10-CM

## 2010-10-01 ENCOUNTER — Ambulatory Visit (INDEPENDENT_AMBULATORY_CARE_PROVIDER_SITE_OTHER): Payer: PRIVATE HEALTH INSURANCE | Admitting: Psychiatry

## 2010-10-01 DIAGNOSIS — F4310 Post-traumatic stress disorder, unspecified: Secondary | ICD-10-CM

## 2010-10-01 DIAGNOSIS — F988 Other specified behavioral and emotional disorders with onset usually occurring in childhood and adolescence: Secondary | ICD-10-CM

## 2010-10-01 DIAGNOSIS — F6389 Other impulse disorders: Secondary | ICD-10-CM

## 2010-10-08 ENCOUNTER — Ambulatory Visit (INDEPENDENT_AMBULATORY_CARE_PROVIDER_SITE_OTHER): Payer: PRIVATE HEALTH INSURANCE | Admitting: Psychiatry

## 2010-10-08 DIAGNOSIS — F988 Other specified behavioral and emotional disorders with onset usually occurring in childhood and adolescence: Secondary | ICD-10-CM

## 2010-10-08 DIAGNOSIS — F6389 Other impulse disorders: Secondary | ICD-10-CM

## 2010-10-08 DIAGNOSIS — F4310 Post-traumatic stress disorder, unspecified: Secondary | ICD-10-CM

## 2010-10-15 ENCOUNTER — Ambulatory Visit (INDEPENDENT_AMBULATORY_CARE_PROVIDER_SITE_OTHER): Payer: PRIVATE HEALTH INSURANCE | Admitting: Psychiatry

## 2010-10-15 DIAGNOSIS — F4310 Post-traumatic stress disorder, unspecified: Secondary | ICD-10-CM

## 2010-10-15 DIAGNOSIS — F6389 Other impulse disorders: Secondary | ICD-10-CM

## 2010-10-15 DIAGNOSIS — F988 Other specified behavioral and emotional disorders with onset usually occurring in childhood and adolescence: Secondary | ICD-10-CM

## 2010-10-22 ENCOUNTER — Ambulatory Visit (INDEPENDENT_AMBULATORY_CARE_PROVIDER_SITE_OTHER): Payer: PRIVATE HEALTH INSURANCE | Admitting: Psychiatry

## 2010-10-22 DIAGNOSIS — F988 Other specified behavioral and emotional disorders with onset usually occurring in childhood and adolescence: Secondary | ICD-10-CM

## 2010-10-22 DIAGNOSIS — F6389 Other impulse disorders: Secondary | ICD-10-CM

## 2010-10-22 DIAGNOSIS — F4310 Post-traumatic stress disorder, unspecified: Secondary | ICD-10-CM

## 2010-10-29 ENCOUNTER — Ambulatory Visit (INDEPENDENT_AMBULATORY_CARE_PROVIDER_SITE_OTHER): Payer: PRIVATE HEALTH INSURANCE | Admitting: Psychiatry

## 2010-10-29 DIAGNOSIS — F4310 Post-traumatic stress disorder, unspecified: Secondary | ICD-10-CM

## 2010-10-29 DIAGNOSIS — F6389 Other impulse disorders: Secondary | ICD-10-CM

## 2010-10-29 DIAGNOSIS — F988 Other specified behavioral and emotional disorders with onset usually occurring in childhood and adolescence: Secondary | ICD-10-CM

## 2010-10-30 LAB — I-STAT 8, (EC8 V) (CONVERTED LAB)
BUN: 9
Chloride: 102
Glucose, Bld: 66 — ABNORMAL LOW
HCT: 53 — ABNORMAL HIGH
Operator id: 272551
pCO2, Ven: 38.5 — ABNORMAL LOW
pH, Ven: 7.36 — ABNORMAL HIGH

## 2010-10-30 LAB — CBC
HCT: 48.4 — ABNORMAL HIGH
Hemoglobin: 16.5 — ABNORMAL HIGH
MCV: 85.4
RBC: 5.67 — ABNORMAL HIGH
WBC: 10.4

## 2010-10-30 LAB — URINE MICROSCOPIC-ADD ON

## 2010-10-30 LAB — URINALYSIS, ROUTINE W REFLEX MICROSCOPIC
Bilirubin Urine: NEGATIVE
Glucose, UA: NEGATIVE
Specific Gravity, Urine: 1.026
pH: 5.5

## 2010-10-30 LAB — DIFFERENTIAL
Eosinophils Relative: 5
Lymphocytes Relative: 21
Lymphs Abs: 2.2
Monocytes Absolute: 0.4
Monocytes Relative: 4

## 2010-10-30 LAB — POCT I-STAT CREATININE
Creatinine, Ser: 1
Operator id: 272551

## 2010-10-30 LAB — RAPID URINE DRUG SCREEN, HOSP PERFORMED: Barbiturates: NOT DETECTED

## 2010-10-30 LAB — ETHANOL: Alcohol, Ethyl (B): <5

## 2010-10-30 LAB — PREGNANCY, URINE: Preg Test, Ur: NEGATIVE

## 2010-11-05 ENCOUNTER — Ambulatory Visit (INDEPENDENT_AMBULATORY_CARE_PROVIDER_SITE_OTHER): Payer: PRIVATE HEALTH INSURANCE | Admitting: Psychiatry

## 2010-11-05 DIAGNOSIS — F988 Other specified behavioral and emotional disorders with onset usually occurring in childhood and adolescence: Secondary | ICD-10-CM

## 2010-11-05 DIAGNOSIS — F4310 Post-traumatic stress disorder, unspecified: Secondary | ICD-10-CM

## 2010-11-05 DIAGNOSIS — F6389 Other impulse disorders: Secondary | ICD-10-CM

## 2010-11-12 ENCOUNTER — Ambulatory Visit (INDEPENDENT_AMBULATORY_CARE_PROVIDER_SITE_OTHER): Payer: PRIVATE HEALTH INSURANCE | Admitting: Psychiatry

## 2010-11-12 DIAGNOSIS — F6389 Other impulse disorders: Secondary | ICD-10-CM

## 2010-11-12 DIAGNOSIS — F988 Other specified behavioral and emotional disorders with onset usually occurring in childhood and adolescence: Secondary | ICD-10-CM

## 2010-11-12 DIAGNOSIS — F4310 Post-traumatic stress disorder, unspecified: Secondary | ICD-10-CM

## 2010-11-19 ENCOUNTER — Ambulatory Visit (INDEPENDENT_AMBULATORY_CARE_PROVIDER_SITE_OTHER): Payer: PRIVATE HEALTH INSURANCE | Admitting: Psychiatry

## 2010-11-19 DIAGNOSIS — F4310 Post-traumatic stress disorder, unspecified: Secondary | ICD-10-CM

## 2010-11-19 DIAGNOSIS — F988 Other specified behavioral and emotional disorders with onset usually occurring in childhood and adolescence: Secondary | ICD-10-CM

## 2010-11-19 DIAGNOSIS — F6389 Other impulse disorders: Secondary | ICD-10-CM

## 2010-11-26 ENCOUNTER — Ambulatory Visit (INDEPENDENT_AMBULATORY_CARE_PROVIDER_SITE_OTHER): Payer: PRIVATE HEALTH INSURANCE | Admitting: Psychiatry

## 2010-11-26 DIAGNOSIS — F6389 Other impulse disorders: Secondary | ICD-10-CM

## 2010-11-26 DIAGNOSIS — F988 Other specified behavioral and emotional disorders with onset usually occurring in childhood and adolescence: Secondary | ICD-10-CM

## 2010-11-26 DIAGNOSIS — F4310 Post-traumatic stress disorder, unspecified: Secondary | ICD-10-CM

## 2010-12-03 ENCOUNTER — Ambulatory Visit (INDEPENDENT_AMBULATORY_CARE_PROVIDER_SITE_OTHER): Payer: PRIVATE HEALTH INSURANCE | Admitting: Psychiatry

## 2010-12-03 DIAGNOSIS — F6389 Other impulse disorders: Secondary | ICD-10-CM

## 2010-12-03 DIAGNOSIS — F988 Other specified behavioral and emotional disorders with onset usually occurring in childhood and adolescence: Secondary | ICD-10-CM

## 2010-12-03 DIAGNOSIS — F4310 Post-traumatic stress disorder, unspecified: Secondary | ICD-10-CM

## 2010-12-10 ENCOUNTER — Ambulatory Visit (INDEPENDENT_AMBULATORY_CARE_PROVIDER_SITE_OTHER): Payer: PRIVATE HEALTH INSURANCE | Admitting: Psychiatry

## 2010-12-10 DIAGNOSIS — F4310 Post-traumatic stress disorder, unspecified: Secondary | ICD-10-CM

## 2010-12-10 DIAGNOSIS — F988 Other specified behavioral and emotional disorders with onset usually occurring in childhood and adolescence: Secondary | ICD-10-CM

## 2010-12-10 DIAGNOSIS — F6389 Other impulse disorders: Secondary | ICD-10-CM

## 2010-12-17 ENCOUNTER — Ambulatory Visit (INDEPENDENT_AMBULATORY_CARE_PROVIDER_SITE_OTHER): Payer: No Typology Code available for payment source | Admitting: Psychiatry

## 2010-12-17 DIAGNOSIS — F988 Other specified behavioral and emotional disorders with onset usually occurring in childhood and adolescence: Secondary | ICD-10-CM

## 2010-12-17 DIAGNOSIS — F4310 Post-traumatic stress disorder, unspecified: Secondary | ICD-10-CM

## 2010-12-17 DIAGNOSIS — F6389 Other impulse disorders: Secondary | ICD-10-CM

## 2010-12-24 ENCOUNTER — Ambulatory Visit (INDEPENDENT_AMBULATORY_CARE_PROVIDER_SITE_OTHER): Payer: Self-pay | Admitting: Psychiatry

## 2010-12-24 DIAGNOSIS — F6389 Other impulse disorders: Secondary | ICD-10-CM

## 2010-12-24 DIAGNOSIS — F4310 Post-traumatic stress disorder, unspecified: Secondary | ICD-10-CM

## 2010-12-24 DIAGNOSIS — F988 Other specified behavioral and emotional disorders with onset usually occurring in childhood and adolescence: Secondary | ICD-10-CM

## 2010-12-31 ENCOUNTER — Ambulatory Visit (INDEPENDENT_AMBULATORY_CARE_PROVIDER_SITE_OTHER): Payer: Self-pay | Admitting: Psychiatry

## 2010-12-31 DIAGNOSIS — F988 Other specified behavioral and emotional disorders with onset usually occurring in childhood and adolescence: Secondary | ICD-10-CM

## 2010-12-31 DIAGNOSIS — F4310 Post-traumatic stress disorder, unspecified: Secondary | ICD-10-CM

## 2010-12-31 DIAGNOSIS — F6389 Other impulse disorders: Secondary | ICD-10-CM

## 2011-01-21 ENCOUNTER — Ambulatory Visit: Payer: Self-pay | Admitting: Psychiatry

## 2011-01-28 ENCOUNTER — Ambulatory Visit (INDEPENDENT_AMBULATORY_CARE_PROVIDER_SITE_OTHER): Payer: Self-pay | Admitting: Psychiatry

## 2011-01-28 DIAGNOSIS — F4310 Post-traumatic stress disorder, unspecified: Secondary | ICD-10-CM

## 2011-01-28 DIAGNOSIS — F6389 Other impulse disorders: Secondary | ICD-10-CM

## 2011-01-28 DIAGNOSIS — F988 Other specified behavioral and emotional disorders with onset usually occurring in childhood and adolescence: Secondary | ICD-10-CM

## 2011-02-04 ENCOUNTER — Ambulatory Visit (INDEPENDENT_AMBULATORY_CARE_PROVIDER_SITE_OTHER): Payer: Self-pay | Admitting: Psychiatry

## 2011-02-04 DIAGNOSIS — F988 Other specified behavioral and emotional disorders with onset usually occurring in childhood and adolescence: Secondary | ICD-10-CM

## 2011-02-04 DIAGNOSIS — F6389 Other impulse disorders: Secondary | ICD-10-CM

## 2011-02-04 DIAGNOSIS — F4310 Post-traumatic stress disorder, unspecified: Secondary | ICD-10-CM

## 2011-02-11 ENCOUNTER — Ambulatory Visit (INDEPENDENT_AMBULATORY_CARE_PROVIDER_SITE_OTHER): Payer: Self-pay | Admitting: Psychiatry

## 2011-02-11 DIAGNOSIS — F631 Pyromania: Secondary | ICD-10-CM

## 2011-02-11 DIAGNOSIS — F4310 Post-traumatic stress disorder, unspecified: Secondary | ICD-10-CM

## 2011-02-11 DIAGNOSIS — F988 Other specified behavioral and emotional disorders with onset usually occurring in childhood and adolescence: Secondary | ICD-10-CM

## 2011-02-18 ENCOUNTER — Ambulatory Visit: Payer: Self-pay | Admitting: Psychiatry

## 2011-02-25 ENCOUNTER — Ambulatory Visit (INDEPENDENT_AMBULATORY_CARE_PROVIDER_SITE_OTHER): Payer: No Typology Code available for payment source | Admitting: Psychiatry

## 2011-02-25 DIAGNOSIS — F6389 Other impulse disorders: Secondary | ICD-10-CM

## 2011-02-25 DIAGNOSIS — F4310 Post-traumatic stress disorder, unspecified: Secondary | ICD-10-CM

## 2011-02-25 DIAGNOSIS — F988 Other specified behavioral and emotional disorders with onset usually occurring in childhood and adolescence: Secondary | ICD-10-CM

## 2011-03-04 ENCOUNTER — Ambulatory Visit (INDEPENDENT_AMBULATORY_CARE_PROVIDER_SITE_OTHER): Payer: No Typology Code available for payment source | Admitting: Psychiatry

## 2011-03-04 DIAGNOSIS — F4310 Post-traumatic stress disorder, unspecified: Secondary | ICD-10-CM

## 2011-03-04 DIAGNOSIS — F6389 Other impulse disorders: Secondary | ICD-10-CM

## 2011-03-04 DIAGNOSIS — F988 Other specified behavioral and emotional disorders with onset usually occurring in childhood and adolescence: Secondary | ICD-10-CM

## 2011-03-11 ENCOUNTER — Ambulatory Visit (INDEPENDENT_AMBULATORY_CARE_PROVIDER_SITE_OTHER): Payer: No Typology Code available for payment source | Admitting: Psychiatry

## 2011-03-11 DIAGNOSIS — F6389 Other impulse disorders: Secondary | ICD-10-CM

## 2011-03-11 DIAGNOSIS — F4310 Post-traumatic stress disorder, unspecified: Secondary | ICD-10-CM

## 2011-03-11 DIAGNOSIS — F988 Other specified behavioral and emotional disorders with onset usually occurring in childhood and adolescence: Secondary | ICD-10-CM

## 2011-03-18 ENCOUNTER — Ambulatory Visit (INDEPENDENT_AMBULATORY_CARE_PROVIDER_SITE_OTHER): Payer: No Typology Code available for payment source | Admitting: Psychiatry

## 2011-03-18 DIAGNOSIS — F6389 Other impulse disorders: Secondary | ICD-10-CM

## 2011-03-18 DIAGNOSIS — F4310 Post-traumatic stress disorder, unspecified: Secondary | ICD-10-CM

## 2011-03-18 DIAGNOSIS — F988 Other specified behavioral and emotional disorders with onset usually occurring in childhood and adolescence: Secondary | ICD-10-CM

## 2011-03-25 ENCOUNTER — Ambulatory Visit (INDEPENDENT_AMBULATORY_CARE_PROVIDER_SITE_OTHER): Payer: No Typology Code available for payment source | Admitting: Psychiatry

## 2011-03-25 DIAGNOSIS — F6389 Other impulse disorders: Secondary | ICD-10-CM

## 2011-03-25 DIAGNOSIS — F988 Other specified behavioral and emotional disorders with onset usually occurring in childhood and adolescence: Secondary | ICD-10-CM

## 2011-03-25 DIAGNOSIS — F4310 Post-traumatic stress disorder, unspecified: Secondary | ICD-10-CM

## 2011-04-01 ENCOUNTER — Ambulatory Visit (INDEPENDENT_AMBULATORY_CARE_PROVIDER_SITE_OTHER): Payer: No Typology Code available for payment source | Admitting: Psychiatry

## 2011-04-01 DIAGNOSIS — F988 Other specified behavioral and emotional disorders with onset usually occurring in childhood and adolescence: Secondary | ICD-10-CM

## 2011-04-01 DIAGNOSIS — F6389 Other impulse disorders: Secondary | ICD-10-CM

## 2011-04-01 DIAGNOSIS — F4310 Post-traumatic stress disorder, unspecified: Secondary | ICD-10-CM

## 2011-04-08 ENCOUNTER — Ambulatory Visit (INDEPENDENT_AMBULATORY_CARE_PROVIDER_SITE_OTHER): Payer: No Typology Code available for payment source | Admitting: Psychiatry

## 2011-04-08 DIAGNOSIS — F4310 Post-traumatic stress disorder, unspecified: Secondary | ICD-10-CM

## 2011-04-08 DIAGNOSIS — F988 Other specified behavioral and emotional disorders with onset usually occurring in childhood and adolescence: Secondary | ICD-10-CM

## 2011-04-08 DIAGNOSIS — F6389 Other impulse disorders: Secondary | ICD-10-CM

## 2011-04-15 ENCOUNTER — Ambulatory Visit (INDEPENDENT_AMBULATORY_CARE_PROVIDER_SITE_OTHER): Payer: No Typology Code available for payment source | Admitting: Psychiatry

## 2011-04-15 DIAGNOSIS — F988 Other specified behavioral and emotional disorders with onset usually occurring in childhood and adolescence: Secondary | ICD-10-CM

## 2011-04-15 DIAGNOSIS — F4310 Post-traumatic stress disorder, unspecified: Secondary | ICD-10-CM

## 2011-04-15 DIAGNOSIS — F6389 Other impulse disorders: Secondary | ICD-10-CM

## 2011-04-22 ENCOUNTER — Ambulatory Visit: Payer: No Typology Code available for payment source | Admitting: Psychiatry

## 2011-04-29 ENCOUNTER — Ambulatory Visit: Payer: No Typology Code available for payment source | Admitting: Psychiatry

## 2011-05-06 ENCOUNTER — Ambulatory Visit (INDEPENDENT_AMBULATORY_CARE_PROVIDER_SITE_OTHER): Payer: No Typology Code available for payment source | Admitting: Psychiatry

## 2011-05-06 DIAGNOSIS — F4310 Post-traumatic stress disorder, unspecified: Secondary | ICD-10-CM

## 2011-05-06 DIAGNOSIS — F6389 Other impulse disorders: Secondary | ICD-10-CM

## 2011-05-06 DIAGNOSIS — F988 Other specified behavioral and emotional disorders with onset usually occurring in childhood and adolescence: Secondary | ICD-10-CM

## 2011-05-13 ENCOUNTER — Ambulatory Visit (INDEPENDENT_AMBULATORY_CARE_PROVIDER_SITE_OTHER): Payer: No Typology Code available for payment source | Admitting: Psychiatry

## 2011-05-13 DIAGNOSIS — F4310 Post-traumatic stress disorder, unspecified: Secondary | ICD-10-CM

## 2011-05-13 DIAGNOSIS — F988 Other specified behavioral and emotional disorders with onset usually occurring in childhood and adolescence: Secondary | ICD-10-CM

## 2011-05-13 DIAGNOSIS — F6389 Other impulse disorders: Secondary | ICD-10-CM

## 2011-05-20 ENCOUNTER — Ambulatory Visit (INDEPENDENT_AMBULATORY_CARE_PROVIDER_SITE_OTHER): Payer: No Typology Code available for payment source | Admitting: Psychiatry

## 2011-05-20 DIAGNOSIS — F4310 Post-traumatic stress disorder, unspecified: Secondary | ICD-10-CM

## 2011-05-20 DIAGNOSIS — F6389 Other impulse disorders: Secondary | ICD-10-CM

## 2011-05-20 DIAGNOSIS — F988 Other specified behavioral and emotional disorders with onset usually occurring in childhood and adolescence: Secondary | ICD-10-CM

## 2011-05-27 ENCOUNTER — Ambulatory Visit: Payer: No Typology Code available for payment source | Admitting: Psychiatry

## 2011-06-03 ENCOUNTER — Ambulatory Visit (INDEPENDENT_AMBULATORY_CARE_PROVIDER_SITE_OTHER): Payer: No Typology Code available for payment source | Admitting: Psychiatry

## 2011-06-03 DIAGNOSIS — F4310 Post-traumatic stress disorder, unspecified: Secondary | ICD-10-CM

## 2011-06-03 DIAGNOSIS — F6389 Other impulse disorders: Secondary | ICD-10-CM

## 2011-06-03 DIAGNOSIS — F988 Other specified behavioral and emotional disorders with onset usually occurring in childhood and adolescence: Secondary | ICD-10-CM

## 2011-06-10 ENCOUNTER — Ambulatory Visit (INDEPENDENT_AMBULATORY_CARE_PROVIDER_SITE_OTHER): Payer: No Typology Code available for payment source | Admitting: Psychiatry

## 2011-06-10 DIAGNOSIS — F988 Other specified behavioral and emotional disorders with onset usually occurring in childhood and adolescence: Secondary | ICD-10-CM

## 2011-06-10 DIAGNOSIS — F4310 Post-traumatic stress disorder, unspecified: Secondary | ICD-10-CM

## 2011-06-10 DIAGNOSIS — F6389 Other impulse disorders: Secondary | ICD-10-CM

## 2011-06-17 ENCOUNTER — Ambulatory Visit: Payer: No Typology Code available for payment source | Admitting: Psychiatry

## 2011-06-24 ENCOUNTER — Ambulatory Visit (INDEPENDENT_AMBULATORY_CARE_PROVIDER_SITE_OTHER): Payer: No Typology Code available for payment source | Admitting: Psychiatry

## 2011-06-24 DIAGNOSIS — F4310 Post-traumatic stress disorder, unspecified: Secondary | ICD-10-CM

## 2011-06-24 DIAGNOSIS — F6389 Other impulse disorders: Secondary | ICD-10-CM

## 2011-06-24 DIAGNOSIS — F988 Other specified behavioral and emotional disorders with onset usually occurring in childhood and adolescence: Secondary | ICD-10-CM

## 2011-07-01 ENCOUNTER — Ambulatory Visit (INDEPENDENT_AMBULATORY_CARE_PROVIDER_SITE_OTHER): Payer: No Typology Code available for payment source | Admitting: Psychiatry

## 2011-07-01 DIAGNOSIS — F6389 Other impulse disorders: Secondary | ICD-10-CM

## 2011-07-01 DIAGNOSIS — F4310 Post-traumatic stress disorder, unspecified: Secondary | ICD-10-CM

## 2011-07-01 DIAGNOSIS — F988 Other specified behavioral and emotional disorders with onset usually occurring in childhood and adolescence: Secondary | ICD-10-CM

## 2011-07-08 ENCOUNTER — Ambulatory Visit (INDEPENDENT_AMBULATORY_CARE_PROVIDER_SITE_OTHER): Payer: No Typology Code available for payment source | Admitting: Psychiatry

## 2011-07-08 DIAGNOSIS — F4310 Post-traumatic stress disorder, unspecified: Secondary | ICD-10-CM

## 2011-07-08 DIAGNOSIS — F6389 Other impulse disorders: Secondary | ICD-10-CM

## 2011-07-08 DIAGNOSIS — F988 Other specified behavioral and emotional disorders with onset usually occurring in childhood and adolescence: Secondary | ICD-10-CM

## 2011-07-22 ENCOUNTER — Ambulatory Visit (INDEPENDENT_AMBULATORY_CARE_PROVIDER_SITE_OTHER): Payer: No Typology Code available for payment source | Admitting: Psychiatry

## 2011-07-22 DIAGNOSIS — F988 Other specified behavioral and emotional disorders with onset usually occurring in childhood and adolescence: Secondary | ICD-10-CM

## 2011-07-22 DIAGNOSIS — F4310 Post-traumatic stress disorder, unspecified: Secondary | ICD-10-CM

## 2011-07-22 DIAGNOSIS — F6389 Other impulse disorders: Secondary | ICD-10-CM

## 2011-07-29 ENCOUNTER — Ambulatory Visit (INDEPENDENT_AMBULATORY_CARE_PROVIDER_SITE_OTHER): Payer: No Typology Code available for payment source | Admitting: Psychiatry

## 2011-07-29 DIAGNOSIS — F4310 Post-traumatic stress disorder, unspecified: Secondary | ICD-10-CM

## 2011-07-29 DIAGNOSIS — F6389 Other impulse disorders: Secondary | ICD-10-CM

## 2011-07-29 DIAGNOSIS — F988 Other specified behavioral and emotional disorders with onset usually occurring in childhood and adolescence: Secondary | ICD-10-CM

## 2011-08-05 ENCOUNTER — Ambulatory Visit: Payer: No Typology Code available for payment source | Admitting: Psychiatry

## 2011-08-19 ENCOUNTER — Ambulatory Visit (INDEPENDENT_AMBULATORY_CARE_PROVIDER_SITE_OTHER): Payer: No Typology Code available for payment source | Admitting: Psychiatry

## 2011-08-19 DIAGNOSIS — F6389 Other impulse disorders: Secondary | ICD-10-CM

## 2011-08-19 DIAGNOSIS — F4310 Post-traumatic stress disorder, unspecified: Secondary | ICD-10-CM

## 2011-08-19 DIAGNOSIS — F988 Other specified behavioral and emotional disorders with onset usually occurring in childhood and adolescence: Secondary | ICD-10-CM

## 2011-08-26 ENCOUNTER — Ambulatory Visit: Payer: No Typology Code available for payment source | Admitting: Psychiatry

## 2011-09-09 ENCOUNTER — Ambulatory Visit (INDEPENDENT_AMBULATORY_CARE_PROVIDER_SITE_OTHER): Payer: No Typology Code available for payment source | Admitting: Psychiatry

## 2011-09-09 DIAGNOSIS — F6389 Other impulse disorders: Secondary | ICD-10-CM

## 2011-09-09 DIAGNOSIS — F988 Other specified behavioral and emotional disorders with onset usually occurring in childhood and adolescence: Secondary | ICD-10-CM

## 2011-09-09 DIAGNOSIS — F4310 Post-traumatic stress disorder, unspecified: Secondary | ICD-10-CM

## 2011-09-16 ENCOUNTER — Ambulatory Visit (INDEPENDENT_AMBULATORY_CARE_PROVIDER_SITE_OTHER): Payer: No Typology Code available for payment source | Admitting: Psychiatry

## 2011-09-16 DIAGNOSIS — F4310 Post-traumatic stress disorder, unspecified: Secondary | ICD-10-CM

## 2011-09-16 DIAGNOSIS — F988 Other specified behavioral and emotional disorders with onset usually occurring in childhood and adolescence: Secondary | ICD-10-CM

## 2011-09-16 DIAGNOSIS — F6389 Other impulse disorders: Secondary | ICD-10-CM

## 2011-09-23 ENCOUNTER — Ambulatory Visit (INDEPENDENT_AMBULATORY_CARE_PROVIDER_SITE_OTHER): Payer: No Typology Code available for payment source | Admitting: Psychiatry

## 2011-09-23 DIAGNOSIS — F988 Other specified behavioral and emotional disorders with onset usually occurring in childhood and adolescence: Secondary | ICD-10-CM

## 2011-09-23 DIAGNOSIS — F4310 Post-traumatic stress disorder, unspecified: Secondary | ICD-10-CM

## 2011-09-23 DIAGNOSIS — F6389 Other impulse disorders: Secondary | ICD-10-CM

## 2011-09-30 ENCOUNTER — Ambulatory Visit (INDEPENDENT_AMBULATORY_CARE_PROVIDER_SITE_OTHER): Payer: No Typology Code available for payment source | Admitting: Psychiatry

## 2011-09-30 DIAGNOSIS — F988 Other specified behavioral and emotional disorders with onset usually occurring in childhood and adolescence: Secondary | ICD-10-CM

## 2011-09-30 DIAGNOSIS — F4310 Post-traumatic stress disorder, unspecified: Secondary | ICD-10-CM

## 2011-09-30 DIAGNOSIS — F6389 Other impulse disorders: Secondary | ICD-10-CM

## 2011-10-07 ENCOUNTER — Ambulatory Visit (INDEPENDENT_AMBULATORY_CARE_PROVIDER_SITE_OTHER): Payer: No Typology Code available for payment source | Admitting: Psychiatry

## 2011-10-07 DIAGNOSIS — F988 Other specified behavioral and emotional disorders with onset usually occurring in childhood and adolescence: Secondary | ICD-10-CM

## 2011-10-07 DIAGNOSIS — F4310 Post-traumatic stress disorder, unspecified: Secondary | ICD-10-CM

## 2011-10-07 DIAGNOSIS — F6389 Other impulse disorders: Secondary | ICD-10-CM

## 2011-10-14 ENCOUNTER — Ambulatory Visit (INDEPENDENT_AMBULATORY_CARE_PROVIDER_SITE_OTHER): Payer: No Typology Code available for payment source | Admitting: Psychiatry

## 2011-10-14 DIAGNOSIS — F988 Other specified behavioral and emotional disorders with onset usually occurring in childhood and adolescence: Secondary | ICD-10-CM

## 2011-10-14 DIAGNOSIS — F6389 Other impulse disorders: Secondary | ICD-10-CM

## 2011-10-14 DIAGNOSIS — F4310 Post-traumatic stress disorder, unspecified: Secondary | ICD-10-CM

## 2011-10-21 ENCOUNTER — Ambulatory Visit (INDEPENDENT_AMBULATORY_CARE_PROVIDER_SITE_OTHER): Payer: No Typology Code available for payment source | Admitting: Psychiatry

## 2011-10-21 DIAGNOSIS — F4310 Post-traumatic stress disorder, unspecified: Secondary | ICD-10-CM

## 2011-10-21 DIAGNOSIS — F988 Other specified behavioral and emotional disorders with onset usually occurring in childhood and adolescence: Secondary | ICD-10-CM

## 2011-10-21 DIAGNOSIS — F6389 Other impulse disorders: Secondary | ICD-10-CM

## 2011-10-28 ENCOUNTER — Ambulatory Visit (INDEPENDENT_AMBULATORY_CARE_PROVIDER_SITE_OTHER): Payer: No Typology Code available for payment source | Admitting: Psychiatry

## 2011-10-28 DIAGNOSIS — F4310 Post-traumatic stress disorder, unspecified: Secondary | ICD-10-CM

## 2011-10-28 DIAGNOSIS — F6389 Other impulse disorders: Secondary | ICD-10-CM

## 2011-10-28 DIAGNOSIS — F988 Other specified behavioral and emotional disorders with onset usually occurring in childhood and adolescence: Secondary | ICD-10-CM

## 2011-11-04 ENCOUNTER — Ambulatory Visit (INDEPENDENT_AMBULATORY_CARE_PROVIDER_SITE_OTHER): Payer: No Typology Code available for payment source | Admitting: Psychiatry

## 2011-11-04 DIAGNOSIS — F4310 Post-traumatic stress disorder, unspecified: Secondary | ICD-10-CM

## 2011-11-04 DIAGNOSIS — F988 Other specified behavioral and emotional disorders with onset usually occurring in childhood and adolescence: Secondary | ICD-10-CM

## 2011-11-04 DIAGNOSIS — F6389 Other impulse disorders: Secondary | ICD-10-CM

## 2011-11-11 ENCOUNTER — Ambulatory Visit (INDEPENDENT_AMBULATORY_CARE_PROVIDER_SITE_OTHER): Payer: No Typology Code available for payment source | Admitting: Psychiatry

## 2011-11-11 DIAGNOSIS — F988 Other specified behavioral and emotional disorders with onset usually occurring in childhood and adolescence: Secondary | ICD-10-CM

## 2011-11-11 DIAGNOSIS — F6389 Other impulse disorders: Secondary | ICD-10-CM

## 2011-11-11 DIAGNOSIS — F4310 Post-traumatic stress disorder, unspecified: Secondary | ICD-10-CM

## 2011-11-18 ENCOUNTER — Ambulatory Visit (INDEPENDENT_AMBULATORY_CARE_PROVIDER_SITE_OTHER): Payer: No Typology Code available for payment source | Admitting: Psychiatry

## 2011-11-18 DIAGNOSIS — F6389 Other impulse disorders: Secondary | ICD-10-CM

## 2011-11-18 DIAGNOSIS — F988 Other specified behavioral and emotional disorders with onset usually occurring in childhood and adolescence: Secondary | ICD-10-CM

## 2011-11-18 DIAGNOSIS — F4310 Post-traumatic stress disorder, unspecified: Secondary | ICD-10-CM

## 2011-11-25 ENCOUNTER — Ambulatory Visit (INDEPENDENT_AMBULATORY_CARE_PROVIDER_SITE_OTHER): Payer: No Typology Code available for payment source | Admitting: Psychiatry

## 2011-11-25 DIAGNOSIS — F988 Other specified behavioral and emotional disorders with onset usually occurring in childhood and adolescence: Secondary | ICD-10-CM

## 2011-11-25 DIAGNOSIS — F4310 Post-traumatic stress disorder, unspecified: Secondary | ICD-10-CM

## 2011-11-25 DIAGNOSIS — F6389 Other impulse disorders: Secondary | ICD-10-CM

## 2011-12-02 ENCOUNTER — Ambulatory Visit (INDEPENDENT_AMBULATORY_CARE_PROVIDER_SITE_OTHER): Payer: No Typology Code available for payment source | Admitting: Psychiatry

## 2011-12-02 DIAGNOSIS — F988 Other specified behavioral and emotional disorders with onset usually occurring in childhood and adolescence: Secondary | ICD-10-CM

## 2011-12-02 DIAGNOSIS — F4310 Post-traumatic stress disorder, unspecified: Secondary | ICD-10-CM

## 2011-12-02 DIAGNOSIS — F6389 Other impulse disorders: Secondary | ICD-10-CM

## 2011-12-09 ENCOUNTER — Ambulatory Visit (INDEPENDENT_AMBULATORY_CARE_PROVIDER_SITE_OTHER): Payer: No Typology Code available for payment source | Admitting: Psychiatry

## 2011-12-09 DIAGNOSIS — F988 Other specified behavioral and emotional disorders with onset usually occurring in childhood and adolescence: Secondary | ICD-10-CM

## 2011-12-09 DIAGNOSIS — F4310 Post-traumatic stress disorder, unspecified: Secondary | ICD-10-CM

## 2011-12-09 DIAGNOSIS — F6389 Other impulse disorders: Secondary | ICD-10-CM

## 2011-12-16 ENCOUNTER — Ambulatory Visit (INDEPENDENT_AMBULATORY_CARE_PROVIDER_SITE_OTHER): Payer: No Typology Code available for payment source | Admitting: Psychiatry

## 2011-12-16 DIAGNOSIS — F988 Other specified behavioral and emotional disorders with onset usually occurring in childhood and adolescence: Secondary | ICD-10-CM

## 2011-12-16 DIAGNOSIS — F4310 Post-traumatic stress disorder, unspecified: Secondary | ICD-10-CM

## 2011-12-16 DIAGNOSIS — F6389 Other impulse disorders: Secondary | ICD-10-CM

## 2011-12-23 ENCOUNTER — Ambulatory Visit (INDEPENDENT_AMBULATORY_CARE_PROVIDER_SITE_OTHER): Payer: No Typology Code available for payment source | Admitting: Psychiatry

## 2011-12-23 DIAGNOSIS — F6389 Other impulse disorders: Secondary | ICD-10-CM

## 2011-12-23 DIAGNOSIS — F988 Other specified behavioral and emotional disorders with onset usually occurring in childhood and adolescence: Secondary | ICD-10-CM

## 2011-12-23 DIAGNOSIS — F4310 Post-traumatic stress disorder, unspecified: Secondary | ICD-10-CM

## 2011-12-30 ENCOUNTER — Ambulatory Visit (INDEPENDENT_AMBULATORY_CARE_PROVIDER_SITE_OTHER): Payer: No Typology Code available for payment source | Admitting: Psychiatry

## 2011-12-30 DIAGNOSIS — F988 Other specified behavioral and emotional disorders with onset usually occurring in childhood and adolescence: Secondary | ICD-10-CM

## 2011-12-30 DIAGNOSIS — F6389 Other impulse disorders: Secondary | ICD-10-CM

## 2011-12-30 DIAGNOSIS — F4310 Post-traumatic stress disorder, unspecified: Secondary | ICD-10-CM

## 2012-01-20 ENCOUNTER — Ambulatory Visit: Payer: No Typology Code available for payment source | Admitting: Psychiatry

## 2012-01-27 ENCOUNTER — Ambulatory Visit (INDEPENDENT_AMBULATORY_CARE_PROVIDER_SITE_OTHER): Payer: No Typology Code available for payment source | Admitting: Psychiatry

## 2012-01-27 DIAGNOSIS — F988 Other specified behavioral and emotional disorders with onset usually occurring in childhood and adolescence: Secondary | ICD-10-CM

## 2012-01-27 DIAGNOSIS — F4310 Post-traumatic stress disorder, unspecified: Secondary | ICD-10-CM

## 2012-01-27 DIAGNOSIS — F6389 Other impulse disorders: Secondary | ICD-10-CM

## 2012-02-03 ENCOUNTER — Ambulatory Visit (INDEPENDENT_AMBULATORY_CARE_PROVIDER_SITE_OTHER): Payer: No Typology Code available for payment source | Admitting: Psychiatry

## 2012-02-03 DIAGNOSIS — F988 Other specified behavioral and emotional disorders with onset usually occurring in childhood and adolescence: Secondary | ICD-10-CM

## 2012-02-03 DIAGNOSIS — F4310 Post-traumatic stress disorder, unspecified: Secondary | ICD-10-CM

## 2012-02-03 DIAGNOSIS — F6389 Other impulse disorders: Secondary | ICD-10-CM

## 2012-02-10 ENCOUNTER — Ambulatory Visit (INDEPENDENT_AMBULATORY_CARE_PROVIDER_SITE_OTHER): Payer: No Typology Code available for payment source | Admitting: Psychiatry

## 2012-02-10 DIAGNOSIS — F988 Other specified behavioral and emotional disorders with onset usually occurring in childhood and adolescence: Secondary | ICD-10-CM

## 2012-02-10 DIAGNOSIS — F6389 Other impulse disorders: Secondary | ICD-10-CM

## 2012-02-10 DIAGNOSIS — F4310 Post-traumatic stress disorder, unspecified: Secondary | ICD-10-CM

## 2012-02-17 ENCOUNTER — Ambulatory Visit (INDEPENDENT_AMBULATORY_CARE_PROVIDER_SITE_OTHER): Payer: No Typology Code available for payment source | Admitting: Psychiatry

## 2012-02-17 DIAGNOSIS — F6389 Other impulse disorders: Secondary | ICD-10-CM

## 2012-02-17 DIAGNOSIS — F988 Other specified behavioral and emotional disorders with onset usually occurring in childhood and adolescence: Secondary | ICD-10-CM

## 2012-02-17 DIAGNOSIS — F4310 Post-traumatic stress disorder, unspecified: Secondary | ICD-10-CM

## 2012-02-24 ENCOUNTER — Ambulatory Visit (INDEPENDENT_AMBULATORY_CARE_PROVIDER_SITE_OTHER): Payer: No Typology Code available for payment source | Admitting: Psychiatry

## 2012-02-24 DIAGNOSIS — F6389 Other impulse disorders: Secondary | ICD-10-CM

## 2012-02-24 DIAGNOSIS — F988 Other specified behavioral and emotional disorders with onset usually occurring in childhood and adolescence: Secondary | ICD-10-CM

## 2012-02-24 DIAGNOSIS — F4310 Post-traumatic stress disorder, unspecified: Secondary | ICD-10-CM

## 2012-03-02 ENCOUNTER — Ambulatory Visit (INDEPENDENT_AMBULATORY_CARE_PROVIDER_SITE_OTHER): Payer: No Typology Code available for payment source | Admitting: Psychiatry

## 2012-03-02 DIAGNOSIS — F6389 Other impulse disorders: Secondary | ICD-10-CM

## 2012-03-02 DIAGNOSIS — F988 Other specified behavioral and emotional disorders with onset usually occurring in childhood and adolescence: Secondary | ICD-10-CM

## 2012-03-02 DIAGNOSIS — F4310 Post-traumatic stress disorder, unspecified: Secondary | ICD-10-CM

## 2012-03-09 ENCOUNTER — Ambulatory Visit: Payer: No Typology Code available for payment source | Admitting: Psychiatry

## 2012-03-16 ENCOUNTER — Ambulatory Visit (INDEPENDENT_AMBULATORY_CARE_PROVIDER_SITE_OTHER): Payer: No Typology Code available for payment source | Admitting: Psychiatry

## 2012-03-16 DIAGNOSIS — F4310 Post-traumatic stress disorder, unspecified: Secondary | ICD-10-CM

## 2012-03-16 DIAGNOSIS — F988 Other specified behavioral and emotional disorders with onset usually occurring in childhood and adolescence: Secondary | ICD-10-CM

## 2012-03-16 DIAGNOSIS — F6389 Other impulse disorders: Secondary | ICD-10-CM

## 2012-03-23 ENCOUNTER — Ambulatory Visit (INDEPENDENT_AMBULATORY_CARE_PROVIDER_SITE_OTHER): Payer: No Typology Code available for payment source | Admitting: Psychiatry

## 2012-03-23 DIAGNOSIS — F6389 Other impulse disorders: Secondary | ICD-10-CM

## 2012-03-23 DIAGNOSIS — F4310 Post-traumatic stress disorder, unspecified: Secondary | ICD-10-CM

## 2012-03-23 DIAGNOSIS — F988 Other specified behavioral and emotional disorders with onset usually occurring in childhood and adolescence: Secondary | ICD-10-CM

## 2012-03-30 ENCOUNTER — Ambulatory Visit: Payer: No Typology Code available for payment source | Admitting: Psychiatry

## 2012-03-31 ENCOUNTER — Emergency Department (INDEPENDENT_AMBULATORY_CARE_PROVIDER_SITE_OTHER)
Admission: EM | Admit: 2012-03-31 | Discharge: 2012-03-31 | Disposition: A | Discharge status: Home / Self Care | Payer: No Typology Code available for payment source | Source: Home / Self Care | Attending: Emergency Medicine | Admitting: Emergency Medicine

## 2012-03-31 ENCOUNTER — Encounter (HOSPITAL_COMMUNITY): Payer: Self-pay | Admitting: Emergency Medicine

## 2012-03-31 DIAGNOSIS — A084 Viral intestinal infection, unspecified: Secondary | ICD-10-CM

## 2012-03-31 LAB — POCT I-STAT, CHEM 8
Calcium, Ion: 1.11 mmol/L — ABNORMAL LOW (ref 1.12–1.23)
Chloride: 108 mEq/L (ref 96–112)
HCT: 45 % (ref 36.0–46.0)
Sodium: 142 mEq/L (ref 135–145)
TCO2: 24 mmol/L (ref 0–100)

## 2012-03-31 MED ORDER — ONDANSETRON HCL 4 MG/2ML IJ SOLN
4.0000 mg | Freq: Once | INTRAMUSCULAR | Status: AC
  Administered 2012-03-31: 4 mg via INTRAVENOUS

## 2012-03-31 MED ORDER — ONDANSETRON HCL 4 MG/2ML IJ SOLN
INTRAMUSCULAR | Status: AC
  Filled 2012-03-31: qty 2

## 2012-03-31 MED ORDER — ONDANSETRON 8 MG PO TBDP: 8.0000 mg | ORAL_TABLET | Freq: Three times a day (TID) | ORAL | Status: DC | PRN

## 2012-03-31 MED ORDER — ONDANSETRON HCL 4 MG/2ML IJ SOLN: 4.0000 mg | Freq: Once | INTRAMUSCULAR | Status: DC

## 2012-03-31 MED ORDER — SODIUM CHLORIDE 0.9 % IV SOLN: INTRAVENOUS | Status: DC

## 2012-03-31 NOTE — ED Notes (Signed)
Pt c/o abd pain onset 0200 this am Sx include: v,d,n, headache, dizziness Denies: fevers Has not had any meds Reports having at least 15 episodes of loose stools and vomiting   She is alert and oriented w/no signs of acute distress.

## 2012-03-31 NOTE — ED Notes (Signed)
Assisted patient from bathroom to treatment room.  Patient continues to breathe fast, c/o numbness and tingling to hands and face.

## 2012-03-31 NOTE — ED Provider Notes (Signed)
Chief Complaint:   Chief Complaint  Patient presents with  . Abdominal Pain    History of Present Illness:   Kaitlyn Franco is a 37 year old female who has had a history since 2 AM this morning of nausea, vomiting, and diarrhea. There's been no blood in the vomitus or stool. She has generalized abdominal pain, feels dizzy, notes numbness of the face and cramping of the hands. She's had no exposures, suspicious ingestions, or foreign travel.  Review of Systems:  Other than noted above, the patient denies any of the following symptoms: Systemic:  No fevers, chills, sweats, weight loss or gain, fatigue, or tiredness. ENT:  No nasal congestion, rhinorrhea, or sore throat. Lungs:  No cough, wheezing, or shortness of breath. Cardiac:  No chest pain, syncope, or presyncope. GI:  No abdominal pain, nausea, vomiting, anorexia, diarrhea, constipation, blood in stool or vomitus. GU:  No dysuria, frequency, or urgency.  PMFSH:  Past medical history, family history, social history, meds, and allergies were reviewed.   Physical Exam:   Vital signs:  BP 98/81  Pulse 93  Temp(Src) 99.2 F (37.3 C) (Oral)  Resp 22  SpO2 98%  LMP 03/17/2012  Filed Vitals:   03/31/12 1417 03/31/12 1541 Supine  03/31/12 1542 Sitting  03/31/12 1543 Standing   BP: 111/71 102/43 103/57 98/81  Pulse: 91 70 85 93  Temp: 99.2 F (37.3 C)     TempSrc: Oral     Resp: 22     SpO2: 98%      General:  Alert and oriented.  In no distress.  Skin warm and dry.  Good skin turgor, brisk capillary refill. ENT:  No scleral icterus, moist mucous membranes, no oral lesions, pharynx clear. Lungs:  Breath sounds clear and equal bilaterally.  No wheezes, rales, or rhonchi. Heart:  Rhythm regular, without extrasystoles.  No gallops or murmers. Abdomen:  Abdomen is soft, flat, nondistended. She has mild, generalized tenderness to palpation in all quadrants without guarding or rebound. There is no localized tenderness to palpation. No  organomegaly or mass. Bowel sounds are hyperactive. Skin: Clear, warm, and dry.  Good turgor.  Brisk capillary refill.  Labs:   Results for orders placed during the hospital encounter of 03/31/12  POCT I-STAT, CHEM 8      Result Value Range   Sodium 142  135 - 145 mEq/L   Potassium 3.9  3.5 - 5.1 mEq/L   Chloride 108  96 - 112 mEq/L   BUN 12  6 - 23 mg/dL   Creatinine, Ser 1.61  0.50 - 1.10 mg/dL   Glucose, Bld 096 (*) 70 - 99 mg/dL   Calcium, Ion 0.45 (*) 1.12 - 1.23 mmol/L   TCO2 24  0 - 100 mmol/L   Hemoglobin 15.3 (*) 12.0 - 15.0 g/dL   HCT 40.9  81.1 - 91.4 %     Course in Urgent Care Center:   She was given a liter of IV normal saline, Zofran 4 mg IV, and then a trial of oral fluids which he tolerated well without any vomiting. Thereafter she felt a lot better and felt like she could go home and try oral rehydration.  Assessment:  The encounter diagnosis was Viral gastroenteritis.  Plan:   1.  The following meds were prescribed:   Discharge Medication List as of 03/31/2012  4:56 PM    START taking these medications   Details  ondansetron (ZOFRAN ODT) 8 MG disintegrating tablet Take 1 tablet (8 mg  total) by mouth every 8 (eight) hours as needed for nausea., Starting 03/31/2012, Until Discontinued, Normal       2.  The patient was instructed in symptomatic care and handouts were given. 3.  The patient was told to return if becoming worse in any way, if no better in 2 or 3 days, and given some red flag symptoms such as fever, worsening pain, or intractable vomiting that would indicate earlier return. 4.  The patient was told to take only sips of clear liquids for the next 24 hours and then advance to a b.r.a.t. Diet.      Reuben Likes, MD 03/31/12 2102

## 2012-04-02 ENCOUNTER — Telehealth (HOSPITAL_COMMUNITY): Payer: Self-pay | Admitting: Family Medicine

## 2012-04-02 NOTE — ED Notes (Signed)
Patient called with request for a work note "extension".  Also reported prescribed generic zofran odt was too expensive.  Spoke with dr Artis Flock.  Received orders for phenergan (generic) 25mg , one tablet every 6 hours as needed for nausea, # 6.  Called this into [facility] [address] at [phone].  Notified patient of new script and where called to, patient agreeable.  Notified patient no note extension to be provided.  Suggested patient be reevaluated if having continued nausea and vomiting.

## 2012-04-04 ENCOUNTER — Encounter (HOSPITAL_COMMUNITY): Payer: Self-pay | Admitting: *Deleted

## 2012-04-04 ENCOUNTER — Emergency Department (INDEPENDENT_AMBULATORY_CARE_PROVIDER_SITE_OTHER)
Admission: EM | Admit: 2012-04-04 | Discharge: 2012-04-04 | Disposition: A | Discharge status: Home / Self Care | Payer: Self-pay | Source: Home / Self Care | Attending: Family Medicine | Admitting: Family Medicine

## 2012-04-04 DIAGNOSIS — K5289 Other specified noninfective gastroenteritis and colitis: Secondary | ICD-10-CM

## 2012-04-04 DIAGNOSIS — K529 Noninfective gastroenteritis and colitis, unspecified: Secondary | ICD-10-CM

## 2012-04-04 NOTE — ED Provider Notes (Signed)
History     CSN: [id number]  Arrival date & time 04/04/12  1247   First MD Initiated Contact with Patient 04/04/12 1312      Chief Complaint  Patient presents with  . Follow-up    (Consider location/radiation/quality/duration/timing/severity/associated sxs/prior treatment) HPI Comments: 37 year old female with no significant past medical history. Was seen here on March 19 with signs of dehydration nausea vomiting and was diagnosed with gastroenteritis. Was treated with IV fluids and had a prescription for ondansetron. She is here for followup today. Patient report has been feeling much better although still feels weak and fatigued. Denies headache or rashes. Did not feel ondansetron prescription as she did not have money to pay for it. Tolerating fluids and solids but no abdominal pain. No vomiting or diarrhea since her last visit. She wants to have a work note extension that includes today.   History reviewed. No pertinent past medical history.  History reviewed. No pertinent past surgical history.  No family history on file.  History  Substance Use Topics  . Smoking status: Never Smoker   . Smokeless tobacco: Not on file  . Alcohol Use: No    OB History   Grav Para Term Preterm Abortions TAB SAB Ect Mult Living                  Review of Systems  Constitutional: Positive for fatigue. Negative for fever, chills and diaphoresis.  HENT: Negative for congestion.   Respiratory: Negative for cough and shortness of breath.   Cardiovascular: Negative for chest pain and leg swelling.  Gastrointestinal: Negative for nausea, vomiting, abdominal pain and diarrhea.  Endocrine: Negative for cold intolerance, heat intolerance, polydipsia, polyphagia and polyuria.  Genitourinary: Negative for dysuria.  Skin: Negative for rash.  Neurological: Negative for dizziness and headaches.    Allergies  Mold extract  Home Medications   Current Outpatient Rx  Name  Route  Sig  Dispense   Refill  . ACYCLOVIR PO   Oral   Take by mouth.         . ondansetron (ZOFRAN ODT) 8 MG disintegrating tablet   Oral   Take 1 tablet (8 mg total) by mouth every 8 (eight) hours as needed for nausea.   20 tablet   0     LMP 03/17/2012  Physical Exam  Nursing note and vitals reviewed. Constitutional: She is oriented to person, place, and time. She appears well-developed and well-nourished. No distress.  HENT:  Head: Normocephalic and atraumatic.  Mouth/Throat: Oropharynx is clear and moist. No oropharyngeal exudate.  Eyes: Conjunctivae are normal. No scleral icterus.  Neck: Neck supple. No thyromegaly present.  Cardiovascular: Normal heart sounds.   Pulmonary/Chest: Breath sounds normal.  Abdominal: Soft. There is no tenderness.  Lymphadenopathy:    She has no cervical adenopathy.  Neurological: She is alert and oriented to person, place, and time.  Skin: No rash noted. She is not diaphoretic.    ED Course  Procedures (including critical care time)  Labs Reviewed - No data to display No results found.   1. Gastroenteritis       MDM  Normal physical exam. Provided a work note and include extension for today. Supportive care and red flags should prompt her return to medical attention discussed with patient and provided in writing.        Sharin Grave, MD 04/07/12 [phone]

## 2012-04-04 NOTE — ED Notes (Signed)
Patient presents for follow up with visit with Dr. Lorenz Coaster for nausea/vomiting. Patient states fatigue dizziness, and diarrhea.

## 2012-04-06 ENCOUNTER — Ambulatory Visit (INDEPENDENT_AMBULATORY_CARE_PROVIDER_SITE_OTHER): Payer: No Typology Code available for payment source | Admitting: Psychiatry

## 2012-04-06 DIAGNOSIS — F4310 Post-traumatic stress disorder, unspecified: Secondary | ICD-10-CM

## 2012-04-06 DIAGNOSIS — F988 Other specified behavioral and emotional disorders with onset usually occurring in childhood and adolescence: Secondary | ICD-10-CM

## 2012-04-06 DIAGNOSIS — F6389 Other impulse disorders: Secondary | ICD-10-CM

## 2012-04-13 ENCOUNTER — Ambulatory Visit (INDEPENDENT_AMBULATORY_CARE_PROVIDER_SITE_OTHER): Payer: No Typology Code available for payment source | Admitting: Psychiatry

## 2012-04-13 DIAGNOSIS — F4310 Post-traumatic stress disorder, unspecified: Secondary | ICD-10-CM

## 2012-04-13 DIAGNOSIS — F331 Major depressive disorder, recurrent, moderate: Secondary | ICD-10-CM

## 2012-04-13 DIAGNOSIS — F988 Other specified behavioral and emotional disorders with onset usually occurring in childhood and adolescence: Secondary | ICD-10-CM

## 2012-04-13 DIAGNOSIS — F6389 Other impulse disorders: Secondary | ICD-10-CM

## 2012-04-20 ENCOUNTER — Ambulatory Visit: Payer: No Typology Code available for payment source | Admitting: Psychiatry

## 2012-04-27 ENCOUNTER — Ambulatory Visit: Payer: No Typology Code available for payment source | Admitting: Psychiatry

## 2012-05-04 ENCOUNTER — Ambulatory Visit (INDEPENDENT_AMBULATORY_CARE_PROVIDER_SITE_OTHER): Payer: No Typology Code available for payment source | Admitting: Psychiatry

## 2012-05-04 ENCOUNTER — Ambulatory Visit: Payer: No Typology Code available for payment source | Admitting: Psychiatry

## 2012-05-04 DIAGNOSIS — F988 Other specified behavioral and emotional disorders with onset usually occurring in childhood and adolescence: Secondary | ICD-10-CM

## 2012-05-04 DIAGNOSIS — F331 Major depressive disorder, recurrent, moderate: Secondary | ICD-10-CM

## 2012-05-04 DIAGNOSIS — F6389 Other impulse disorders: Secondary | ICD-10-CM

## 2012-05-04 DIAGNOSIS — F4310 Post-traumatic stress disorder, unspecified: Secondary | ICD-10-CM

## 2012-05-11 ENCOUNTER — Ambulatory Visit (INDEPENDENT_AMBULATORY_CARE_PROVIDER_SITE_OTHER): Payer: No Typology Code available for payment source | Admitting: Psychiatry

## 2012-05-11 DIAGNOSIS — F988 Other specified behavioral and emotional disorders with onset usually occurring in childhood and adolescence: Secondary | ICD-10-CM

## 2012-05-11 DIAGNOSIS — F6389 Other impulse disorders: Secondary | ICD-10-CM

## 2012-05-11 DIAGNOSIS — F4323 Adjustment disorder with mixed anxiety and depressed mood: Secondary | ICD-10-CM

## 2012-05-11 DIAGNOSIS — F331 Major depressive disorder, recurrent, moderate: Secondary | ICD-10-CM

## 2012-05-18 ENCOUNTER — Ambulatory Visit (INDEPENDENT_AMBULATORY_CARE_PROVIDER_SITE_OTHER): Payer: No Typology Code available for payment source | Admitting: Psychiatry

## 2012-05-18 DIAGNOSIS — F331 Major depressive disorder, recurrent, moderate: Secondary | ICD-10-CM

## 2012-05-18 DIAGNOSIS — F988 Other specified behavioral and emotional disorders with onset usually occurring in childhood and adolescence: Secondary | ICD-10-CM

## 2012-05-18 DIAGNOSIS — F6389 Other impulse disorders: Secondary | ICD-10-CM

## 2012-05-18 DIAGNOSIS — F4310 Post-traumatic stress disorder, unspecified: Secondary | ICD-10-CM

## 2012-05-25 ENCOUNTER — Ambulatory Visit (INDEPENDENT_AMBULATORY_CARE_PROVIDER_SITE_OTHER): Payer: No Typology Code available for payment source | Admitting: Psychiatry

## 2012-05-25 DIAGNOSIS — F6389 Other impulse disorders: Secondary | ICD-10-CM

## 2012-05-25 DIAGNOSIS — F331 Major depressive disorder, recurrent, moderate: Secondary | ICD-10-CM

## 2012-05-25 DIAGNOSIS — F988 Other specified behavioral and emotional disorders with onset usually occurring in childhood and adolescence: Secondary | ICD-10-CM

## 2012-05-25 DIAGNOSIS — F4310 Post-traumatic stress disorder, unspecified: Secondary | ICD-10-CM

## 2012-06-01 ENCOUNTER — Ambulatory Visit: Payer: No Typology Code available for payment source | Admitting: Psychiatry

## 2012-06-08 ENCOUNTER — Ambulatory Visit (INDEPENDENT_AMBULATORY_CARE_PROVIDER_SITE_OTHER): Payer: No Typology Code available for payment source | Admitting: Psychiatry

## 2012-06-08 DIAGNOSIS — F988 Other specified behavioral and emotional disorders with onset usually occurring in childhood and adolescence: Secondary | ICD-10-CM

## 2012-06-08 DIAGNOSIS — F331 Major depressive disorder, recurrent, moderate: Secondary | ICD-10-CM

## 2012-06-08 DIAGNOSIS — F4310 Post-traumatic stress disorder, unspecified: Secondary | ICD-10-CM

## 2012-06-08 DIAGNOSIS — F6389 Other impulse disorders: Secondary | ICD-10-CM

## 2012-06-15 ENCOUNTER — Ambulatory Visit (INDEPENDENT_AMBULATORY_CARE_PROVIDER_SITE_OTHER): Payer: No Typology Code available for payment source | Admitting: Psychiatry

## 2012-06-15 DIAGNOSIS — F6389 Other impulse disorders: Secondary | ICD-10-CM

## 2012-06-15 DIAGNOSIS — F331 Major depressive disorder, recurrent, moderate: Secondary | ICD-10-CM

## 2012-06-15 DIAGNOSIS — F4310 Post-traumatic stress disorder, unspecified: Secondary | ICD-10-CM

## 2012-06-15 DIAGNOSIS — F988 Other specified behavioral and emotional disorders with onset usually occurring in childhood and adolescence: Secondary | ICD-10-CM

## 2012-06-22 ENCOUNTER — Ambulatory Visit (INDEPENDENT_AMBULATORY_CARE_PROVIDER_SITE_OTHER): Payer: No Typology Code available for payment source | Admitting: Psychiatry

## 2012-06-22 DIAGNOSIS — F331 Major depressive disorder, recurrent, moderate: Secondary | ICD-10-CM

## 2012-06-22 DIAGNOSIS — F6389 Other impulse disorders: Secondary | ICD-10-CM

## 2012-06-22 DIAGNOSIS — F4310 Post-traumatic stress disorder, unspecified: Secondary | ICD-10-CM

## 2012-06-22 DIAGNOSIS — F988 Other specified behavioral and emotional disorders with onset usually occurring in childhood and adolescence: Secondary | ICD-10-CM

## 2012-06-29 ENCOUNTER — Ambulatory Visit (INDEPENDENT_AMBULATORY_CARE_PROVIDER_SITE_OTHER): Payer: No Typology Code available for payment source | Admitting: Psychiatry

## 2012-06-29 DIAGNOSIS — F988 Other specified behavioral and emotional disorders with onset usually occurring in childhood and adolescence: Secondary | ICD-10-CM

## 2012-06-29 DIAGNOSIS — F4310 Post-traumatic stress disorder, unspecified: Secondary | ICD-10-CM

## 2012-06-29 DIAGNOSIS — F331 Major depressive disorder, recurrent, moderate: Secondary | ICD-10-CM

## 2012-06-29 DIAGNOSIS — F6389 Other impulse disorders: Secondary | ICD-10-CM

## 2012-07-06 ENCOUNTER — Ambulatory Visit (INDEPENDENT_AMBULATORY_CARE_PROVIDER_SITE_OTHER): Payer: No Typology Code available for payment source | Admitting: Psychiatry

## 2012-07-06 DIAGNOSIS — F6389 Other impulse disorders: Secondary | ICD-10-CM

## 2012-07-06 DIAGNOSIS — F988 Other specified behavioral and emotional disorders with onset usually occurring in childhood and adolescence: Secondary | ICD-10-CM

## 2012-07-06 DIAGNOSIS — F4310 Post-traumatic stress disorder, unspecified: Secondary | ICD-10-CM

## 2012-07-06 DIAGNOSIS — F331 Major depressive disorder, recurrent, moderate: Secondary | ICD-10-CM

## 2012-07-13 ENCOUNTER — Ambulatory Visit (INDEPENDENT_AMBULATORY_CARE_PROVIDER_SITE_OTHER): Payer: No Typology Code available for payment source | Admitting: Psychiatry

## 2012-07-13 DIAGNOSIS — F4310 Post-traumatic stress disorder, unspecified: Secondary | ICD-10-CM

## 2012-07-13 DIAGNOSIS — F988 Other specified behavioral and emotional disorders with onset usually occurring in childhood and adolescence: Secondary | ICD-10-CM

## 2012-07-13 DIAGNOSIS — F331 Major depressive disorder, recurrent, moderate: Secondary | ICD-10-CM

## 2012-07-13 DIAGNOSIS — F6389 Other impulse disorders: Secondary | ICD-10-CM

## 2012-07-20 ENCOUNTER — Ambulatory Visit (INDEPENDENT_AMBULATORY_CARE_PROVIDER_SITE_OTHER): Payer: No Typology Code available for payment source | Admitting: Psychiatry

## 2012-07-20 DIAGNOSIS — F6389 Other impulse disorders: Secondary | ICD-10-CM

## 2012-07-20 DIAGNOSIS — F4310 Post-traumatic stress disorder, unspecified: Secondary | ICD-10-CM

## 2012-07-20 DIAGNOSIS — F331 Major depressive disorder, recurrent, moderate: Secondary | ICD-10-CM

## 2012-07-20 DIAGNOSIS — F988 Other specified behavioral and emotional disorders with onset usually occurring in childhood and adolescence: Secondary | ICD-10-CM

## 2012-07-27 ENCOUNTER — Ambulatory Visit (INDEPENDENT_AMBULATORY_CARE_PROVIDER_SITE_OTHER): Payer: No Typology Code available for payment source | Admitting: Psychiatry

## 2012-07-27 DIAGNOSIS — F6389 Other impulse disorders: Secondary | ICD-10-CM

## 2012-07-27 DIAGNOSIS — F4310 Post-traumatic stress disorder, unspecified: Secondary | ICD-10-CM

## 2012-07-27 DIAGNOSIS — F331 Major depressive disorder, recurrent, moderate: Secondary | ICD-10-CM

## 2012-07-27 DIAGNOSIS — F988 Other specified behavioral and emotional disorders with onset usually occurring in childhood and adolescence: Secondary | ICD-10-CM

## 2012-08-03 ENCOUNTER — Ambulatory Visit (INDEPENDENT_AMBULATORY_CARE_PROVIDER_SITE_OTHER): Payer: No Typology Code available for payment source | Admitting: Psychiatry

## 2012-08-03 DIAGNOSIS — F4310 Post-traumatic stress disorder, unspecified: Secondary | ICD-10-CM

## 2012-08-03 DIAGNOSIS — F988 Other specified behavioral and emotional disorders with onset usually occurring in childhood and adolescence: Secondary | ICD-10-CM

## 2012-08-03 DIAGNOSIS — F331 Major depressive disorder, recurrent, moderate: Secondary | ICD-10-CM

## 2012-08-03 DIAGNOSIS — F6389 Other impulse disorders: Secondary | ICD-10-CM

## 2012-08-10 ENCOUNTER — Ambulatory Visit (INDEPENDENT_AMBULATORY_CARE_PROVIDER_SITE_OTHER): Payer: No Typology Code available for payment source | Admitting: Psychiatry

## 2012-08-10 DIAGNOSIS — F4310 Post-traumatic stress disorder, unspecified: Secondary | ICD-10-CM

## 2012-08-10 DIAGNOSIS — F331 Major depressive disorder, recurrent, moderate: Secondary | ICD-10-CM

## 2012-08-10 DIAGNOSIS — F988 Other specified behavioral and emotional disorders with onset usually occurring in childhood and adolescence: Secondary | ICD-10-CM

## 2012-08-10 DIAGNOSIS — F6389 Other impulse disorders: Secondary | ICD-10-CM

## 2012-08-17 ENCOUNTER — Ambulatory Visit (INDEPENDENT_AMBULATORY_CARE_PROVIDER_SITE_OTHER): Payer: No Typology Code available for payment source | Admitting: Psychiatry

## 2012-08-17 DIAGNOSIS — F988 Other specified behavioral and emotional disorders with onset usually occurring in childhood and adolescence: Secondary | ICD-10-CM

## 2012-08-17 DIAGNOSIS — F6389 Other impulse disorders: Secondary | ICD-10-CM

## 2012-08-17 DIAGNOSIS — F331 Major depressive disorder, recurrent, moderate: Secondary | ICD-10-CM

## 2012-08-17 DIAGNOSIS — F4310 Post-traumatic stress disorder, unspecified: Secondary | ICD-10-CM

## 2012-08-24 ENCOUNTER — Ambulatory Visit (INDEPENDENT_AMBULATORY_CARE_PROVIDER_SITE_OTHER): Payer: No Typology Code available for payment source | Admitting: Psychiatry

## 2012-08-24 DIAGNOSIS — F4310 Post-traumatic stress disorder, unspecified: Secondary | ICD-10-CM

## 2012-08-24 DIAGNOSIS — F6389 Other impulse disorders: Secondary | ICD-10-CM

## 2012-08-24 DIAGNOSIS — F988 Other specified behavioral and emotional disorders with onset usually occurring in childhood and adolescence: Secondary | ICD-10-CM

## 2012-08-24 DIAGNOSIS — F331 Major depressive disorder, recurrent, moderate: Secondary | ICD-10-CM

## 2012-08-31 ENCOUNTER — Ambulatory Visit (INDEPENDENT_AMBULATORY_CARE_PROVIDER_SITE_OTHER): Payer: No Typology Code available for payment source | Admitting: Psychiatry

## 2012-08-31 DIAGNOSIS — F6389 Other impulse disorders: Secondary | ICD-10-CM

## 2012-08-31 DIAGNOSIS — F4310 Post-traumatic stress disorder, unspecified: Secondary | ICD-10-CM

## 2012-08-31 DIAGNOSIS — F988 Other specified behavioral and emotional disorders with onset usually occurring in childhood and adolescence: Secondary | ICD-10-CM

## 2012-08-31 DIAGNOSIS — F331 Major depressive disorder, recurrent, moderate: Secondary | ICD-10-CM

## 2012-09-07 ENCOUNTER — Ambulatory Visit (INDEPENDENT_AMBULATORY_CARE_PROVIDER_SITE_OTHER): Payer: No Typology Code available for payment source | Admitting: Psychiatry

## 2012-09-07 DIAGNOSIS — F6389 Other impulse disorders: Secondary | ICD-10-CM

## 2012-09-07 DIAGNOSIS — F331 Major depressive disorder, recurrent, moderate: Secondary | ICD-10-CM

## 2012-09-07 DIAGNOSIS — F4310 Post-traumatic stress disorder, unspecified: Secondary | ICD-10-CM

## 2012-09-07 DIAGNOSIS — F988 Other specified behavioral and emotional disorders with onset usually occurring in childhood and adolescence: Secondary | ICD-10-CM

## 2012-09-14 ENCOUNTER — Ambulatory Visit (INDEPENDENT_AMBULATORY_CARE_PROVIDER_SITE_OTHER): Payer: No Typology Code available for payment source | Admitting: Psychiatry

## 2012-09-14 DIAGNOSIS — F988 Other specified behavioral and emotional disorders with onset usually occurring in childhood and adolescence: Secondary | ICD-10-CM

## 2012-09-14 DIAGNOSIS — F4310 Post-traumatic stress disorder, unspecified: Secondary | ICD-10-CM

## 2012-09-14 DIAGNOSIS — F6389 Other impulse disorders: Secondary | ICD-10-CM

## 2012-09-14 DIAGNOSIS — F331 Major depressive disorder, recurrent, moderate: Secondary | ICD-10-CM

## 2012-09-21 ENCOUNTER — Ambulatory Visit (INDEPENDENT_AMBULATORY_CARE_PROVIDER_SITE_OTHER): Payer: No Typology Code available for payment source | Admitting: Psychiatry

## 2012-09-21 DIAGNOSIS — F6389 Other impulse disorders: Secondary | ICD-10-CM

## 2012-09-21 DIAGNOSIS — F4310 Post-traumatic stress disorder, unspecified: Secondary | ICD-10-CM

## 2012-09-21 DIAGNOSIS — F988 Other specified behavioral and emotional disorders with onset usually occurring in childhood and adolescence: Secondary | ICD-10-CM

## 2012-09-21 DIAGNOSIS — F331 Major depressive disorder, recurrent, moderate: Secondary | ICD-10-CM

## 2012-09-28 ENCOUNTER — Ambulatory Visit (INDEPENDENT_AMBULATORY_CARE_PROVIDER_SITE_OTHER): Payer: No Typology Code available for payment source | Admitting: Psychiatry

## 2012-09-28 DIAGNOSIS — F4310 Post-traumatic stress disorder, unspecified: Secondary | ICD-10-CM

## 2012-09-28 DIAGNOSIS — F331 Major depressive disorder, recurrent, moderate: Secondary | ICD-10-CM

## 2012-09-28 DIAGNOSIS — F6389 Other impulse disorders: Secondary | ICD-10-CM

## 2012-09-28 DIAGNOSIS — F988 Other specified behavioral and emotional disorders with onset usually occurring in childhood and adolescence: Secondary | ICD-10-CM

## 2012-10-05 ENCOUNTER — Ambulatory Visit (INDEPENDENT_AMBULATORY_CARE_PROVIDER_SITE_OTHER): Payer: No Typology Code available for payment source | Admitting: Psychiatry

## 2012-10-05 DIAGNOSIS — F988 Other specified behavioral and emotional disorders with onset usually occurring in childhood and adolescence: Secondary | ICD-10-CM

## 2012-10-05 DIAGNOSIS — F331 Major depressive disorder, recurrent, moderate: Secondary | ICD-10-CM

## 2012-10-05 DIAGNOSIS — F6389 Other impulse disorders: Secondary | ICD-10-CM

## 2012-10-05 DIAGNOSIS — F4310 Post-traumatic stress disorder, unspecified: Secondary | ICD-10-CM

## 2012-10-12 ENCOUNTER — Ambulatory Visit (INDEPENDENT_AMBULATORY_CARE_PROVIDER_SITE_OTHER): Payer: No Typology Code available for payment source | Admitting: Psychiatry

## 2012-10-12 DIAGNOSIS — F6389 Other impulse disorders: Secondary | ICD-10-CM

## 2012-10-12 DIAGNOSIS — F4310 Post-traumatic stress disorder, unspecified: Secondary | ICD-10-CM

## 2012-10-12 DIAGNOSIS — F331 Major depressive disorder, recurrent, moderate: Secondary | ICD-10-CM

## 2012-10-12 DIAGNOSIS — F988 Other specified behavioral and emotional disorders with onset usually occurring in childhood and adolescence: Secondary | ICD-10-CM

## 2012-10-19 ENCOUNTER — Ambulatory Visit (INDEPENDENT_AMBULATORY_CARE_PROVIDER_SITE_OTHER): Payer: No Typology Code available for payment source | Admitting: Psychiatry

## 2012-10-19 DIAGNOSIS — F6389 Other impulse disorders: Secondary | ICD-10-CM

## 2012-10-19 DIAGNOSIS — F988 Other specified behavioral and emotional disorders with onset usually occurring in childhood and adolescence: Secondary | ICD-10-CM

## 2012-10-19 DIAGNOSIS — F331 Major depressive disorder, recurrent, moderate: Secondary | ICD-10-CM

## 2012-10-19 DIAGNOSIS — F4310 Post-traumatic stress disorder, unspecified: Secondary | ICD-10-CM

## 2012-10-26 ENCOUNTER — Ambulatory Visit (INDEPENDENT_AMBULATORY_CARE_PROVIDER_SITE_OTHER): Payer: No Typology Code available for payment source | Admitting: Psychiatry

## 2012-10-26 DIAGNOSIS — F4310 Post-traumatic stress disorder, unspecified: Secondary | ICD-10-CM

## 2012-10-26 DIAGNOSIS — F6389 Other impulse disorders: Secondary | ICD-10-CM

## 2012-10-26 DIAGNOSIS — F331 Major depressive disorder, recurrent, moderate: Secondary | ICD-10-CM

## 2012-10-26 DIAGNOSIS — F988 Other specified behavioral and emotional disorders with onset usually occurring in childhood and adolescence: Secondary | ICD-10-CM

## 2012-11-02 ENCOUNTER — Ambulatory Visit (INDEPENDENT_AMBULATORY_CARE_PROVIDER_SITE_OTHER): Payer: No Typology Code available for payment source | Admitting: Psychiatry

## 2012-11-02 DIAGNOSIS — F6389 Other impulse disorders: Secondary | ICD-10-CM

## 2012-11-02 DIAGNOSIS — F331 Major depressive disorder, recurrent, moderate: Secondary | ICD-10-CM

## 2012-11-02 DIAGNOSIS — F988 Other specified behavioral and emotional disorders with onset usually occurring in childhood and adolescence: Secondary | ICD-10-CM

## 2012-11-02 DIAGNOSIS — F4310 Post-traumatic stress disorder, unspecified: Secondary | ICD-10-CM

## 2012-11-09 ENCOUNTER — Ambulatory Visit (INDEPENDENT_AMBULATORY_CARE_PROVIDER_SITE_OTHER): Payer: No Typology Code available for payment source | Admitting: Psychiatry

## 2012-11-09 DIAGNOSIS — F988 Other specified behavioral and emotional disorders with onset usually occurring in childhood and adolescence: Secondary | ICD-10-CM

## 2012-11-09 DIAGNOSIS — F331 Major depressive disorder, recurrent, moderate: Secondary | ICD-10-CM

## 2012-11-09 DIAGNOSIS — F6389 Other impulse disorders: Secondary | ICD-10-CM

## 2012-11-09 DIAGNOSIS — F4310 Post-traumatic stress disorder, unspecified: Secondary | ICD-10-CM

## 2012-11-16 ENCOUNTER — Ambulatory Visit: Payer: No Typology Code available for payment source | Admitting: Psychiatry

## 2012-11-17 ENCOUNTER — Ambulatory Visit: Payer: No Typology Code available for payment source | Admitting: Psychiatry

## 2012-11-23 ENCOUNTER — Ambulatory Visit (INDEPENDENT_AMBULATORY_CARE_PROVIDER_SITE_OTHER): Payer: No Typology Code available for payment source | Admitting: Psychiatry

## 2012-11-23 DIAGNOSIS — F331 Major depressive disorder, recurrent, moderate: Secondary | ICD-10-CM

## 2012-11-23 DIAGNOSIS — F6389 Other impulse disorders: Secondary | ICD-10-CM

## 2012-11-23 DIAGNOSIS — F4310 Post-traumatic stress disorder, unspecified: Secondary | ICD-10-CM

## 2012-11-23 DIAGNOSIS — F988 Other specified behavioral and emotional disorders with onset usually occurring in childhood and adolescence: Secondary | ICD-10-CM

## 2012-11-30 ENCOUNTER — Ambulatory Visit (INDEPENDENT_AMBULATORY_CARE_PROVIDER_SITE_OTHER): Payer: No Typology Code available for payment source | Admitting: Psychiatry

## 2012-11-30 DIAGNOSIS — F988 Other specified behavioral and emotional disorders with onset usually occurring in childhood and adolescence: Secondary | ICD-10-CM

## 2012-11-30 DIAGNOSIS — F4310 Post-traumatic stress disorder, unspecified: Secondary | ICD-10-CM

## 2012-11-30 DIAGNOSIS — F6389 Other impulse disorders: Secondary | ICD-10-CM

## 2012-11-30 DIAGNOSIS — F331 Major depressive disorder, recurrent, moderate: Secondary | ICD-10-CM

## 2012-12-07 ENCOUNTER — Ambulatory Visit (INDEPENDENT_AMBULATORY_CARE_PROVIDER_SITE_OTHER): Payer: Self-pay | Admitting: Psychiatry

## 2012-12-07 DIAGNOSIS — F988 Other specified behavioral and emotional disorders with onset usually occurring in childhood and adolescence: Secondary | ICD-10-CM

## 2012-12-07 DIAGNOSIS — F4310 Post-traumatic stress disorder, unspecified: Secondary | ICD-10-CM

## 2012-12-07 DIAGNOSIS — F6389 Other impulse disorders: Secondary | ICD-10-CM

## 2012-12-07 DIAGNOSIS — F331 Major depressive disorder, recurrent, moderate: Secondary | ICD-10-CM

## 2012-12-14 ENCOUNTER — Ambulatory Visit: Payer: No Typology Code available for payment source | Admitting: Psychiatry

## 2012-12-21 ENCOUNTER — Ambulatory Visit (INDEPENDENT_AMBULATORY_CARE_PROVIDER_SITE_OTHER): Payer: Self-pay | Admitting: Psychiatry

## 2012-12-21 DIAGNOSIS — F988 Other specified behavioral and emotional disorders with onset usually occurring in childhood and adolescence: Secondary | ICD-10-CM

## 2012-12-21 DIAGNOSIS — F331 Major depressive disorder, recurrent, moderate: Secondary | ICD-10-CM

## 2012-12-21 DIAGNOSIS — F4310 Post-traumatic stress disorder, unspecified: Secondary | ICD-10-CM

## 2012-12-21 DIAGNOSIS — F6389 Other impulse disorders: Secondary | ICD-10-CM

## 2012-12-28 ENCOUNTER — Ambulatory Visit (INDEPENDENT_AMBULATORY_CARE_PROVIDER_SITE_OTHER): Payer: Self-pay | Admitting: Psychiatry

## 2012-12-28 DIAGNOSIS — F331 Major depressive disorder, recurrent, moderate: Secondary | ICD-10-CM

## 2012-12-28 DIAGNOSIS — F988 Other specified behavioral and emotional disorders with onset usually occurring in childhood and adolescence: Secondary | ICD-10-CM

## 2012-12-28 DIAGNOSIS — F4310 Post-traumatic stress disorder, unspecified: Secondary | ICD-10-CM

## 2012-12-28 DIAGNOSIS — F6389 Other impulse disorders: Secondary | ICD-10-CM

## 2013-01-04 ENCOUNTER — Ambulatory Visit (INDEPENDENT_AMBULATORY_CARE_PROVIDER_SITE_OTHER): Payer: Self-pay | Admitting: Psychiatry

## 2013-01-04 DIAGNOSIS — F4310 Post-traumatic stress disorder, unspecified: Secondary | ICD-10-CM

## 2013-01-04 DIAGNOSIS — F988 Other specified behavioral and emotional disorders with onset usually occurring in childhood and adolescence: Secondary | ICD-10-CM

## 2013-01-04 DIAGNOSIS — F331 Major depressive disorder, recurrent, moderate: Secondary | ICD-10-CM

## 2013-01-04 DIAGNOSIS — F6389 Other impulse disorders: Secondary | ICD-10-CM

## 2013-01-11 ENCOUNTER — Ambulatory Visit (INDEPENDENT_AMBULATORY_CARE_PROVIDER_SITE_OTHER): Payer: Self-pay | Admitting: Psychiatry

## 2013-01-11 DIAGNOSIS — F4310 Post-traumatic stress disorder, unspecified: Secondary | ICD-10-CM

## 2013-01-11 DIAGNOSIS — F331 Major depressive disorder, recurrent, moderate: Secondary | ICD-10-CM

## 2013-01-11 DIAGNOSIS — F6389 Other impulse disorders: Secondary | ICD-10-CM

## 2013-01-11 DIAGNOSIS — F988 Other specified behavioral and emotional disorders with onset usually occurring in childhood and adolescence: Secondary | ICD-10-CM

## 2013-01-18 ENCOUNTER — Ambulatory Visit (INDEPENDENT_AMBULATORY_CARE_PROVIDER_SITE_OTHER): Payer: Self-pay | Admitting: Psychiatry

## 2013-01-18 DIAGNOSIS — F988 Other specified behavioral and emotional disorders with onset usually occurring in childhood and adolescence: Secondary | ICD-10-CM

## 2013-01-18 DIAGNOSIS — F331 Major depressive disorder, recurrent, moderate: Secondary | ICD-10-CM

## 2013-01-18 DIAGNOSIS — F4310 Post-traumatic stress disorder, unspecified: Secondary | ICD-10-CM

## 2013-01-18 DIAGNOSIS — F6389 Other impulse disorders: Secondary | ICD-10-CM

## 2013-01-25 ENCOUNTER — Ambulatory Visit (INDEPENDENT_AMBULATORY_CARE_PROVIDER_SITE_OTHER): Payer: Self-pay | Admitting: Psychiatry

## 2013-01-25 DIAGNOSIS — F6389 Other impulse disorders: Secondary | ICD-10-CM

## 2013-01-25 DIAGNOSIS — F988 Other specified behavioral and emotional disorders with onset usually occurring in childhood and adolescence: Secondary | ICD-10-CM

## 2013-01-25 DIAGNOSIS — F331 Major depressive disorder, recurrent, moderate: Secondary | ICD-10-CM

## 2013-01-25 DIAGNOSIS — F4310 Post-traumatic stress disorder, unspecified: Secondary | ICD-10-CM

## 2013-02-01 ENCOUNTER — Ambulatory Visit (INDEPENDENT_AMBULATORY_CARE_PROVIDER_SITE_OTHER): Payer: Self-pay | Admitting: Psychiatry

## 2013-02-01 DIAGNOSIS — F331 Major depressive disorder, recurrent, moderate: Secondary | ICD-10-CM

## 2013-02-01 DIAGNOSIS — F988 Other specified behavioral and emotional disorders with onset usually occurring in childhood and adolescence: Secondary | ICD-10-CM

## 2013-02-01 DIAGNOSIS — F6389 Other impulse disorders: Secondary | ICD-10-CM

## 2013-02-01 DIAGNOSIS — F4310 Post-traumatic stress disorder, unspecified: Secondary | ICD-10-CM

## 2013-02-08 ENCOUNTER — Ambulatory Visit (INDEPENDENT_AMBULATORY_CARE_PROVIDER_SITE_OTHER): Payer: Self-pay | Admitting: Psychiatry

## 2013-02-08 DIAGNOSIS — F988 Other specified behavioral and emotional disorders with onset usually occurring in childhood and adolescence: Secondary | ICD-10-CM

## 2013-02-08 DIAGNOSIS — F6389 Other impulse disorders: Secondary | ICD-10-CM

## 2013-02-08 DIAGNOSIS — F4310 Post-traumatic stress disorder, unspecified: Secondary | ICD-10-CM

## 2013-02-08 DIAGNOSIS — F331 Major depressive disorder, recurrent, moderate: Secondary | ICD-10-CM

## 2013-02-15 ENCOUNTER — Ambulatory Visit (INDEPENDENT_AMBULATORY_CARE_PROVIDER_SITE_OTHER): Payer: Self-pay | Admitting: Psychiatry

## 2013-02-15 DIAGNOSIS — F988 Other specified behavioral and emotional disorders with onset usually occurring in childhood and adolescence: Secondary | ICD-10-CM

## 2013-02-15 DIAGNOSIS — F331 Major depressive disorder, recurrent, moderate: Secondary | ICD-10-CM

## 2013-02-15 DIAGNOSIS — F6389 Other impulse disorders: Secondary | ICD-10-CM

## 2013-02-15 DIAGNOSIS — F4310 Post-traumatic stress disorder, unspecified: Secondary | ICD-10-CM

## 2013-02-22 ENCOUNTER — Ambulatory Visit (INDEPENDENT_AMBULATORY_CARE_PROVIDER_SITE_OTHER): Payer: Self-pay | Admitting: Psychiatry

## 2013-02-22 DIAGNOSIS — F331 Major depressive disorder, recurrent, moderate: Secondary | ICD-10-CM

## 2013-02-22 DIAGNOSIS — F6389 Other impulse disorders: Secondary | ICD-10-CM

## 2013-02-22 DIAGNOSIS — F988 Other specified behavioral and emotional disorders with onset usually occurring in childhood and adolescence: Secondary | ICD-10-CM

## 2013-02-22 DIAGNOSIS — F4310 Post-traumatic stress disorder, unspecified: Secondary | ICD-10-CM

## 2013-03-01 ENCOUNTER — Ambulatory Visit: Payer: No Typology Code available for payment source | Admitting: Psychiatry

## 2013-03-08 ENCOUNTER — Ambulatory Visit: Payer: No Typology Code available for payment source | Admitting: Psychiatry

## 2013-03-15 ENCOUNTER — Ambulatory Visit (INDEPENDENT_AMBULATORY_CARE_PROVIDER_SITE_OTHER): Payer: No Typology Code available for payment source | Admitting: Psychiatry

## 2013-03-15 ENCOUNTER — Ambulatory Visit: Payer: No Typology Code available for payment source | Admitting: Psychiatry

## 2013-03-15 DIAGNOSIS — F4310 Post-traumatic stress disorder, unspecified: Secondary | ICD-10-CM

## 2013-03-15 DIAGNOSIS — F6389 Other impulse disorders: Secondary | ICD-10-CM

## 2013-03-15 DIAGNOSIS — F988 Other specified behavioral and emotional disorders with onset usually occurring in childhood and adolescence: Secondary | ICD-10-CM

## 2013-03-15 DIAGNOSIS — F331 Major depressive disorder, recurrent, moderate: Secondary | ICD-10-CM

## 2013-03-22 ENCOUNTER — Ambulatory Visit (INDEPENDENT_AMBULATORY_CARE_PROVIDER_SITE_OTHER): Payer: No Typology Code available for payment source | Admitting: Psychiatry

## 2013-03-22 DIAGNOSIS — F6389 Other impulse disorders: Secondary | ICD-10-CM

## 2013-03-22 DIAGNOSIS — F988 Other specified behavioral and emotional disorders with onset usually occurring in childhood and adolescence: Secondary | ICD-10-CM

## 2013-03-22 DIAGNOSIS — F331 Major depressive disorder, recurrent, moderate: Secondary | ICD-10-CM

## 2013-03-22 DIAGNOSIS — F4310 Post-traumatic stress disorder, unspecified: Secondary | ICD-10-CM

## 2013-03-29 ENCOUNTER — Ambulatory Visit: Payer: No Typology Code available for payment source | Admitting: Psychiatry

## 2013-04-01 ENCOUNTER — Encounter (HOSPITAL_COMMUNITY): Payer: Self-pay | Admitting: Emergency Medicine

## 2013-04-01 ENCOUNTER — Emergency Department (INDEPENDENT_AMBULATORY_CARE_PROVIDER_SITE_OTHER)
Admission: EM | Admit: 2013-04-01 | Discharge: 2013-04-01 | Disposition: A | Discharge status: Home / Self Care | Payer: Self-pay | Source: Home / Self Care | Attending: Family Medicine | Admitting: Family Medicine

## 2013-04-01 DIAGNOSIS — J302 Other seasonal allergic rhinitis: Secondary | ICD-10-CM

## 2013-04-01 DIAGNOSIS — J309 Allergic rhinitis, unspecified: Secondary | ICD-10-CM

## 2013-04-01 MED ORDER — METHYLPREDNISOLONE ACETATE 80 MG/ML IJ SUSP
INTRAMUSCULAR | Status: AC
  Filled 2013-04-01: qty 1

## 2013-04-01 MED ORDER — TRIAMCINOLONE ACETONIDE 40 MG/ML IJ SUSP
40.0000 mg | Freq: Once | INTRAMUSCULAR | Status: AC
Start: 2013-04-01 — End: 2013-04-01
  Administered 2013-04-01: 40 mg via INTRAMUSCULAR

## 2013-04-01 MED ORDER — FEXOFENADINE HCL 180 MG PO TABS: 180.0000 mg | ORAL_TABLET | Freq: Every day | ORAL | Status: DC

## 2013-04-01 MED ORDER — TRIAMCINOLONE ACETONIDE 40 MG/ML IJ SUSP
INTRAMUSCULAR | Status: AC
  Filled 2013-04-01: qty 1

## 2013-04-01 MED ORDER — GUAIFENESIN-CODEINE 100-10 MG/5ML PO SYRP: 10.0000 mL | ORAL_SOLUTION | Freq: Four times a day (QID) | ORAL | Status: DC | PRN

## 2013-04-01 MED ORDER — METHYLPREDNISOLONE ACETATE 40 MG/ML IJ SUSP
80.0000 mg | Freq: Once | INTRAMUSCULAR | Status: AC
  Administered 2013-04-01: 80 mg via INTRAMUSCULAR

## 2013-04-01 MED ORDER — IPRATROPIUM BROMIDE 0.06 % NA SOLN: 2.0000 | Freq: Four times a day (QID) | NASAL | Status: DC

## 2013-04-01 NOTE — ED Provider Notes (Signed)
CSN: [id number]     Arrival date & time 04/01/13  [id number] History   First MD Initiated Contact with Patient 04/01/13 1057     Chief Complaint  Patient presents with  . URI   (Consider location/radiation/quality/duration/timing/severity/associated sxs/prior Treatment) Patient is a 38 y.o. female presenting with URI. The history is provided by the patient.  URI Presenting symptoms: congestion, cough, facial pain, fatigue and rhinorrhea   Presenting symptoms: no fever   Severity:  Mild Onset quality:  Gradual Duration:  2 days Progression:  Unchanged Chronicity:  New Associated symptoms: sneezing   Associated symptoms: no wheezing     History reviewed. No pertinent past medical history. History reviewed. No pertinent past surgical history. History reviewed. No pertinent family history. History  Substance Use Topics  . Smoking status: Never Smoker   . Smokeless tobacco: Not on file  . Alcohol Use: No   OB History   Grav Para Term Preterm Abortions TAB SAB Ect Mult Living                 Review of Systems  Constitutional: Positive for fatigue. Negative for fever.  HENT: Positive for congestion, postnasal drip, rhinorrhea and sneezing.   Respiratory: Positive for cough. Negative for wheezing.   Cardiovascular: Negative.     Allergies  Mold extract  Home Medications   Current Outpatient Rx  Name  Route  Sig  Dispense  Refill  . ACYCLOVIR PO   Oral   Take by mouth.         . fexofenadine (ALLEGRA) 180 MG tablet   Oral   Take 1 tablet (180 mg total) by mouth daily.   30 tablet   1   . guaiFENesin-codeine (ROBITUSSIN AC) 100-10 MG/5ML syrup   Oral   Take 10 mLs by mouth 4 (four) times daily as needed for cough.   180 mL   0   . ipratropium (ATROVENT) 0.06 % nasal spray   Each Nare   Place 2 sprays into both nostrils 4 (four) times daily.   15 mL   1   . ondansetron (ZOFRAN ODT) 8 MG disintegrating tablet   Oral   Take 1 tablet (8 mg total) by mouth every  8 (eight) hours as needed for nausea.   20 tablet   0    BP 104/73  Pulse 66  Temp(Src) 98.7 F (37.1 C) (Oral)  Resp 16  SpO2 97%  LMP 03/30/2013 Physical Exam  Nursing note and vitals reviewed. Constitutional: She is oriented to person, place, and time. She appears well-developed and well-nourished. No distress.  HENT:  Head: Normocephalic.  Right Ear: External ear normal.  Left Ear: External ear normal.  Mouth/Throat: Oropharynx is clear and moist.  Eyes: Pupils are equal, round, and reactive to light.  Neck: Normal range of motion. Neck supple.  Cardiovascular: Normal heart sounds.   Pulmonary/Chest: Breath sounds normal.  Lymphadenopathy:    She has no cervical adenopathy.  Neurological: She is alert and oriented to person, place, and time.  Skin: Skin is warm and dry.    ED Course  Procedures (including critical care time) Labs Review Labs Reviewed - No data to display Imaging Review No results found.   MDM   1. Seasonal allergic rhinitis        Linna HoffJames D Kindl, MD 04/01/13 1110

## 2013-04-01 NOTE — ED Notes (Signed)
Pt  Reports  Symptoms  Of  Cough   Congestion      Sinus  Drainage  Pressure  As  Well  As  Fatigue   Pt  Reports  The  Symptoms came  On     Rapidly  Yesterday       While  At  Work     Pt  denys  Any  Vomiting /  Diarrhea  But reports  Slight  Nausea

## 2013-04-03 ENCOUNTER — Encounter (HOSPITAL_COMMUNITY): Payer: Self-pay | Admitting: Emergency Medicine

## 2013-04-03 ENCOUNTER — Emergency Department (INDEPENDENT_AMBULATORY_CARE_PROVIDER_SITE_OTHER)
Admission: EM | Admit: 2013-04-03 | Discharge: 2013-04-03 | Disposition: A | Discharge status: Home / Self Care | Payer: Self-pay | Source: Home / Self Care | Attending: Family Medicine | Admitting: Family Medicine

## 2013-04-03 DIAGNOSIS — J309 Allergic rhinitis, unspecified: Secondary | ICD-10-CM

## 2013-04-03 HISTORY — DX: Other seasonal allergic rhinitis: J30.2

## 2013-04-03 MED ORDER — PREDNISONE 5 MG PO TABS: ORAL_TABLET | ORAL | Status: DC

## 2013-04-03 NOTE — ED Provider Notes (Signed)
CSN: [id number]     Arrival date & time 04/03/13  1824 History   First MD Initiated Contact with Patient 04/03/13 1942     Chief Complaint  Patient presents with  . Nasal Congestion   (Consider location/radiation/quality/duration/timing/severity/associated sxs/prior Treatment) HPI Comments: 38 year old female presents requesting a work note. She was seen here a few days ago and was diagnosed with allergic rhinitis. She did not take any of the medication she was prescribed she has not gotten better. She has taken some over-the-counter allergy medicine and she has started to get slightly better but she had an episode of vertigo this morning so she skipped work. She would like a work note. She confirms her symptoms are all getting better.   Past Medical History  Diagnosis Date  . Seasonal allergies    History reviewed. No pertinent past surgical history. Family History  Problem Relation Age of Onset  . Alcohol abuse Father    History  Substance Use Topics  . Smoking status: Never Smoker   . Smokeless tobacco: Not on file  . Alcohol Use: No   OB History   Grav Para Term Preterm Abortions TAB SAB Ect Mult Living                 Review of Systems  Constitutional: Negative for fever and chills.  HENT: Positive for congestion, postnasal drip, rhinorrhea and sinus pressure. Negative for ear pain and sore throat.   Eyes: Negative for visual disturbance.  Respiratory: Positive for cough. Negative for shortness of breath.   Cardiovascular: Negative for chest pain, palpitations and leg swelling.  Gastrointestinal: Negative for nausea, vomiting and abdominal pain.  Endocrine: Negative for polydipsia and polyuria.  Genitourinary: Negative for dysuria, urgency and frequency.  Musculoskeletal: Negative for arthralgias and myalgias.  Skin: Negative for rash.  Neurological: Positive for dizziness. Negative for weakness and light-headedness.    Allergies  Mold extract  Home Medications    Current Outpatient Rx  Name  Route  Sig  Dispense  Refill  . ACYCLOVIR PO   Oral   Take by mouth.         . fexofenadine (ALLEGRA) 180 MG tablet   Oral   Take 1 tablet (180 mg total) by mouth daily.   30 tablet   1   . guaiFENesin-codeine (ROBITUSSIN AC) 100-10 MG/5ML syrup   Oral   Take 10 mLs by mouth 4 (four) times daily as needed for cough.   180 mL   0   . ipratropium (ATROVENT) 0.06 % nasal spray   Each Nare   Place 2 sprays into both nostrils 4 (four) times daily.   15 mL   1   . ondansetron (ZOFRAN ODT) 8 MG disintegrating tablet   Oral   Take 1 tablet (8 mg total) by mouth every 8 (eight) hours as needed for nausea.   20 tablet   0   . predniSONE (DELTASONE) 5 MG tablet      4 tabs PO QD for 4 days;  3 tabs PO QD for 3 days;  2 tabs PO QD for 2 days;  1 tab PO QD for 1 day   30 tablet   0    BP 108/70  Pulse 72  Temp(Src) 98.8 F (37.1 C) (Oral)  Resp 20  SpO2 97%  LMP 03/30/2013 Physical Exam  Nursing note and vitals reviewed. Constitutional: She is oriented to person, place, and time. Vital signs are normal. She appears well-developed and well-nourished. No  distress.  HENT:  Head: Normocephalic and atraumatic.  Right Ear: External ear normal.  Left Ear: External ear normal.  Nose: Mucosal edema and rhinorrhea present. Right sinus exhibits no maxillary sinus tenderness and no frontal sinus tenderness. Left sinus exhibits no maxillary sinus tenderness and no frontal sinus tenderness.  Mouth/Throat: Oropharynx is clear and moist. No oropharyngeal exudate.  Eyes: Conjunctivae are normal. Right eye exhibits no discharge. Left eye exhibits no discharge.  Neck: Normal range of motion. Neck supple.  Cardiovascular: Normal rate, regular rhythm, normal heart sounds and intact distal pulses.  Exam reveals no gallop and no friction rub.   No murmur heard. Pulmonary/Chest: Effort normal and breath sounds normal. No respiratory distress. She has no  wheezes. She has no rales.  Lymphadenopathy:    She has no cervical adenopathy.  Neurological: She is alert and oriented to person, place, and time. She has normal strength. Coordination normal.  Skin: Skin is warm and dry. No rash noted. She is not diaphoretic.  Psychiatric: She has a normal mood and affect. Judgment normal.    ED Course  Procedures (including critical care time) Labs Review Labs Reviewed - No data to display Imaging Review No results found.   MDM   1. Allergic rhinitis    Rx pred and told to get chlorpheneramine-PSE.  Work note provided.  F/u PRN    Meds ordered this encounter  Medications  . predniSONE (DELTASONE) 5 MG tablet    Sig: 4 tabs PO QD for 4 days;  3 tabs PO QD for 3 days;  2 tabs PO QD for 2 days;  1 tab PO QD for 1 day    Dispense:  30 tablet    Refill:  0    Order Specific Question:  Supervising Provider    Answer:  Bradd Canary D [[id number]]     Graylon Good, PA-C 04/03/13 2002

## 2013-04-03 NOTE — ED Notes (Signed)
C/o nasal congestion, fatigue, dizziness, feeling like she is going to faint, cough, and sneezing.  Seen here on Thur.  Work note said to return to work today, but does not feel well enough.  She said its her allergies but has never had it this bad.

## 2013-04-03 NOTE — Discharge Instructions (Signed)
Take OTC combination chlorpheneramine-pseudoephedrine as needed for allergy symptoms and nasal congestion.   Allergic Rhinitis Allergic rhinitis is when the mucous membranes in the nose respond to allergens. Allergens are particles in the air that cause your body to have an allergic reaction. This causes you to release allergic antibodies. Through a chain of events, these eventually cause you to release histamine into the blood stream. Although meant to protect the body, it is this release of histamine that causes your discomfort, such as frequent sneezing, congestion, and an itchy, runny nose.  CAUSES  Seasonal allergic rhinitis (hay fever) is caused by pollen allergens that may come from grasses, trees, and weeds. Year-round allergic rhinitis (perennial allergic rhinitis) is caused by allergens such as house dust mites, pet dander, and mold spores.  SYMPTOMS   Nasal stuffiness (congestion).  Itchy, runny nose with sneezing and tearing of the eyes. DIAGNOSIS  Your health care provider can help you determine the allergen or allergens that trigger your symptoms. If you and your health care provider are unable to determine the allergen, skin or blood testing may be used. TREATMENT  Allergic Rhinitis does not have a cure, but it can be controlled by:  Medicines and allergy shots (immunotherapy).  Avoiding the allergen. Hay fever may often be treated with antihistamines in pill or nasal spray forms. Antihistamines block the effects of histamine. There are over-the-counter medicines that may help with nasal congestion and swelling around the eyes. Check with your health care provider before taking or giving this medicine.  If avoiding the allergen or the medicine prescribed do not work, there are many new medicines your health care provider can prescribe. Stronger medicine may be used if initial measures are ineffective. Desensitizing injections can be used if medicine and avoidance does not work.  Desensitization is when a patient is given ongoing shots until the body becomes less sensitive to the allergen. Make sure you follow up with your health care provider if problems continue. HOME CARE INSTRUCTIONS It is not possible to completely avoid allergens, but you can reduce your symptoms by taking steps to limit your exposure to them. It helps to know exactly what you are allergic to so that you can avoid your specific triggers. SEEK MEDICAL CARE IF:   You have a fever.  You develop a cough that does not stop easily (persistent).  You have shortness of breath.  You start wheezing.  Symptoms interfere with normal daily activities. Document Released: 09/24/2000 Document Revised: 10/20/2012 Document Reviewed: 09/06/2012 [facility] Patient Information 2014 [place], [place].

## 2013-04-03 NOTE — ED Notes (Signed)
Patient wanting to extend work note another day to include this day off.  Told patient she would need to be reevaluated.  Patient admitted she did not get any scripts filled-says she could not afford any of them.  Patient will not be working again until Friday per patient.

## 2013-04-04 NOTE — ED Provider Notes (Signed)
Medical screening examination/treatment/procedure(s) were performed by resident physician or non-physician practitioner and as supervising physician I was immediately available for consultation/collaboration.   KINDL,JAMES DOUGLAS MD.   James D Kindl, MD 04/04/13 2234 

## 2013-04-05 ENCOUNTER — Ambulatory Visit: Payer: No Typology Code available for payment source | Admitting: Psychiatry

## 2013-04-11 ENCOUNTER — Other Ambulatory Visit (HOSPITAL_COMMUNITY): Payer: Self-pay | Admitting: Nurse Practitioner

## 2013-04-11 ENCOUNTER — Other Ambulatory Visit: Payer: Self-pay | Admitting: Obstetrics & Gynecology

## 2013-04-11 DIAGNOSIS — Z803 Family history of malignant neoplasm of breast: Secondary | ICD-10-CM

## 2013-04-11 DIAGNOSIS — Z1231 Encounter for screening mammogram for malignant neoplasm of breast: Secondary | ICD-10-CM

## 2013-04-19 ENCOUNTER — Ambulatory Visit (INDEPENDENT_AMBULATORY_CARE_PROVIDER_SITE_OTHER): Payer: No Typology Code available for payment source | Admitting: Psychiatry

## 2013-04-19 DIAGNOSIS — F988 Other specified behavioral and emotional disorders with onset usually occurring in childhood and adolescence: Secondary | ICD-10-CM

## 2013-04-19 DIAGNOSIS — F4310 Post-traumatic stress disorder, unspecified: Secondary | ICD-10-CM

## 2013-04-19 DIAGNOSIS — F331 Major depressive disorder, recurrent, moderate: Secondary | ICD-10-CM

## 2013-04-19 DIAGNOSIS — F6389 Other impulse disorders: Secondary | ICD-10-CM

## 2013-04-26 ENCOUNTER — Ambulatory Visit (HOSPITAL_COMMUNITY): Payer: No Typology Code available for payment source | Attending: Nurse Practitioner

## 2013-04-26 ENCOUNTER — Ambulatory Visit (INDEPENDENT_AMBULATORY_CARE_PROVIDER_SITE_OTHER): Payer: No Typology Code available for payment source | Admitting: Psychiatry

## 2013-04-26 DIAGNOSIS — F4310 Post-traumatic stress disorder, unspecified: Secondary | ICD-10-CM

## 2013-04-26 DIAGNOSIS — F331 Major depressive disorder, recurrent, moderate: Secondary | ICD-10-CM

## 2013-04-26 DIAGNOSIS — F988 Other specified behavioral and emotional disorders with onset usually occurring in childhood and adolescence: Secondary | ICD-10-CM

## 2013-04-26 DIAGNOSIS — F6389 Other impulse disorders: Secondary | ICD-10-CM

## 2013-05-03 ENCOUNTER — Ambulatory Visit (INDEPENDENT_AMBULATORY_CARE_PROVIDER_SITE_OTHER): Payer: No Typology Code available for payment source | Admitting: Psychiatry

## 2013-05-03 DIAGNOSIS — F331 Major depressive disorder, recurrent, moderate: Secondary | ICD-10-CM

## 2013-05-03 DIAGNOSIS — F6389 Other impulse disorders: Secondary | ICD-10-CM

## 2013-05-03 DIAGNOSIS — F4310 Post-traumatic stress disorder, unspecified: Secondary | ICD-10-CM

## 2013-05-03 DIAGNOSIS — F988 Other specified behavioral and emotional disorders with onset usually occurring in childhood and adolescence: Secondary | ICD-10-CM

## 2013-05-10 ENCOUNTER — Ambulatory Visit (INDEPENDENT_AMBULATORY_CARE_PROVIDER_SITE_OTHER): Payer: No Typology Code available for payment source | Admitting: Psychiatry

## 2013-05-10 DIAGNOSIS — F988 Other specified behavioral and emotional disorders with onset usually occurring in childhood and adolescence: Secondary | ICD-10-CM

## 2013-05-10 DIAGNOSIS — F4310 Post-traumatic stress disorder, unspecified: Secondary | ICD-10-CM

## 2013-05-10 DIAGNOSIS — F918 Other conduct disorders: Secondary | ICD-10-CM

## 2013-05-10 DIAGNOSIS — F331 Major depressive disorder, recurrent, moderate: Secondary | ICD-10-CM

## 2013-05-17 ENCOUNTER — Ambulatory Visit: Payer: No Typology Code available for payment source | Admitting: Psychiatry

## 2013-05-23 ENCOUNTER — Ambulatory Visit: Payer: No Typology Code available for payment source | Attending: Internal Medicine

## 2013-05-24 ENCOUNTER — Ambulatory Visit (INDEPENDENT_AMBULATORY_CARE_PROVIDER_SITE_OTHER): Payer: Self-pay | Admitting: Psychiatry

## 2013-05-24 DIAGNOSIS — F4310 Post-traumatic stress disorder, unspecified: Secondary | ICD-10-CM

## 2013-05-24 DIAGNOSIS — F6389 Other impulse disorders: Secondary | ICD-10-CM

## 2013-05-24 DIAGNOSIS — F331 Major depressive disorder, recurrent, moderate: Secondary | ICD-10-CM

## 2013-05-24 DIAGNOSIS — F988 Other specified behavioral and emotional disorders with onset usually occurring in childhood and adolescence: Secondary | ICD-10-CM

## 2013-05-31 ENCOUNTER — Ambulatory Visit (INDEPENDENT_AMBULATORY_CARE_PROVIDER_SITE_OTHER): Payer: Self-pay | Admitting: Psychiatry

## 2013-05-31 DIAGNOSIS — F4310 Post-traumatic stress disorder, unspecified: Secondary | ICD-10-CM

## 2013-05-31 DIAGNOSIS — F6389 Other impulse disorders: Secondary | ICD-10-CM

## 2013-05-31 DIAGNOSIS — F331 Major depressive disorder, recurrent, moderate: Secondary | ICD-10-CM

## 2013-05-31 DIAGNOSIS — F988 Other specified behavioral and emotional disorders with onset usually occurring in childhood and adolescence: Secondary | ICD-10-CM

## 2013-06-14 ENCOUNTER — Ambulatory Visit: Payer: No Typology Code available for payment source | Admitting: Psychiatry

## 2013-06-21 ENCOUNTER — Ambulatory Visit: Payer: No Typology Code available for payment source | Admitting: Psychiatry

## 2013-06-23 ENCOUNTER — Emergency Department (INDEPENDENT_AMBULATORY_CARE_PROVIDER_SITE_OTHER)
Admission: EM | Admit: 2013-06-23 | Discharge: 2013-06-23 | Disposition: A | Discharge status: Home / Self Care | Payer: No Typology Code available for payment source | Source: Home / Self Care

## 2013-06-23 ENCOUNTER — Encounter (HOSPITAL_COMMUNITY): Payer: Self-pay | Admitting: Emergency Medicine

## 2013-06-23 DIAGNOSIS — M461 Sacroiliitis, not elsewhere classified: Secondary | ICD-10-CM

## 2013-06-23 DIAGNOSIS — M549 Dorsalgia, unspecified: Secondary | ICD-10-CM

## 2013-06-23 MED ORDER — TRAMADOL HCL 50 MG PO TABS: 50.0000 mg | ORAL_TABLET | Freq: Four times a day (QID) | ORAL | Status: DC | PRN

## 2013-06-23 MED ORDER — PREDNISONE 50 MG PO TABS: ORAL_TABLET | ORAL | Status: DC

## 2013-06-23 NOTE — ED Provider Notes (Signed)
CSN: [id number]     Arrival date & time 06/23/13  1007 History   None    Chief Complaint  Patient presents with  . Back Pain   (Consider location/radiation/quality/duration/timing/severity/associated sxs/prior Treatment) HPI  Back pain: started 1 wk ago. Getting worse. Unsure of insighting event but feels it may have come on after 2 forceful sneezes. Lower back w/ radiation. Worse w/ certain movements, especially w/ stooping and standing. Heat and cold and massage w/o much benefit. Denies any loss of bowels or bladder, falls, numbness or tingling. No radiation down legs. Works on [employer] all day long and unable to perform work at this time. Took aleve x1 w/o much benefit.    Past Medical History  Diagnosis Date  . Seasonal allergies    History reviewed. No pertinent past surgical history. Family History  Problem Relation Age of Onset  . Alcohol abuse Father    History  Substance Use Topics  . Smoking status: Never Smoker   . Smokeless tobacco: Not on file  . Alcohol Use: No   OB History   Grav Para Term Preterm Abortions TAB SAB Ect Mult Living                 Review of Systems  Musculoskeletal: Positive for arthralgias and back pain.  All other systems reviewed and are negative.   Allergies  Mold extract  Home Medications   Prior to Admission medications   Medication Sig Start Date End Date Taking? Authorizing Provider  ACYCLOVIR PO Take by mouth.    Historical Provider, MD  fexofenadine (ALLEGRA) 180 MG tablet Take 1 tablet (180 mg total) by mouth daily. 04/01/13   Linna Hoff, MD  guaiFENesin-codeine [facility]) 100-10 MG/5ML syrup Take 10 mLs by mouth 4 (four) times daily as needed for cough. 04/01/13   Linna Hoff, MD  ipratropium (ATROVENT) 0.06 % nasal spray Place 2 sprays into both nostrils 4 (four) times daily. 04/01/13   Linna Hoff, MD  ondansetron (ZOFRAN ODT) 8 MG disintegrating tablet Take 1 tablet (8 mg total) by mouth every 8 (eight) hours as  needed for nausea. 03/31/12   Reuben Likes, MD  predniSONE (DELTASONE) 5 MG tablet 4 tabs PO QD for 4 days;  3 tabs PO QD for 3 days;  2 tabs PO QD for 2 days;  1 tab PO QD for 1 day 04/03/13   Graylon Good, PA-C  predniSONE (DELTASONE) 50 MG tablet Take daily with breakfast 06/23/13   Ozella Rocks, MD  traMADol (ULTRAM) 50 MG tablet Take 1-2 tablets (50-100 mg total) by mouth every 6 (six) hours as needed. 06/23/13   Ozella Rocks, MD   BP 101/73  Pulse 62  Temp(Src) 98.1 F (36.7 C) (Oral)  Resp 14  SpO2 100%  LMP 06/07/2013 Physical Exam  Constitutional: She is oriented to person, place, and time. She appears well-developed and well-nourished. No distress.  HENT:  Head: Normocephalic and atraumatic.  Eyes: EOM are normal. Pupils are equal, round, and reactive to light.  Neck: Normal range of motion. Neck supple.  Cardiovascular: Normal rate, normal heart sounds and intact distal pulses.   No murmur heard. Pulmonary/Chest: Effort normal and breath sounds normal. No respiratory distress.  Abdominal: Soft. She exhibits no distension.  Musculoskeletal:  FROM though painful w/ going from standing to sitting and visa versa. Straight leg w/ minimal pain. FABERS + bilat. No abnormal coordination or loss of strength bilat.   Neurological: She  is alert and oriented to person, place, and time. She exhibits normal muscle tone.  Skin: Skin is warm and dry. She is not diaphoretic.  Psychiatric: She has a normal mood and affect. Her behavior is normal. Judgment and thought content normal.    ED Course  Procedures (including critical care time) Labs Review Labs Reviewed - No data to display  Imaging Review No results found.   MDM   1. Back pain   2. SI (sacroiliac) joint inflammation    Back pain most consistent w/ SI joint inflammation. Prednisone, Tramadol and exercises discussed. Pt to f/u w/ PCP regarding PT vs ortho eval as this is a chronic condition for pt.  -  precautions given and all questions answered  Shelly Flattenavid Merrell, MD Family Medicine PGY-3 06/23/2013, 12:03 PM      Ozella Rocksavid J Merrell, MD 06/23/13 [phone]

## 2013-06-23 NOTE — ED Notes (Signed)
C/o back pain States its hard to sit down for long periods of time States she used hot/cold therapy, massages, and topical med as tx States she feels as if she pulled a muscle a week ago Denies any urinary or bowel problems

## 2013-06-23 NOTE — Discharge Instructions (Signed)
You are suffering from SI joint inflammation  The steroids will help to stop the inflammation Please use the tramadol for pain Please follow up with your regular doctor as you may need forma physical therapy or evaluation by orthopedics for possible injections Please start a daily stretching regimen of the hips and lower back  Sacroiliac Joint Dysfunction The sacroiliac joint connects the lower part of the spine (the sacrum) with the bones of the pelvis. CAUSES  Sometimes, there is no obvious reason for sacroiliac joint dysfunction. Other times, it may occur   During pregnancy.  After injury, such as:  Car accidents.  Sport-related injuries.  Work-related injuries.  Due to one leg being shorter than the other.  Due to other conditions that affect the joints, such as:  Rheumatoid arthritis.  Gout.  Psoriasis.  Joint infection (septic arthritis). SYMPTOMS  Symptoms may include:  Pain in the:  Lower back.  Buttocks.  Groin.  Thighs and legs.  Difficult sitting, standing, walking, lying, bending or lifting. DIAGNOSIS  A number of tests may be used to help diagnose the cause of sacroiliac joint dysfunction, including:  Imaging tests to look for other causes of pain, including:  MRI.  CT scan.  Bone scan.  Diagnostic injection: During a special x-ray (called fluoroscopy), a needle is put into the sacroiliac joint. A numbing medicine is injected into the joint. If the pain is improved or stopped, the diagnosis of sacroiliac joint dysfunction is more likely. TREATMENT  There are a number of types of treatment used for sacroiliac joint dysfunction, including:  Only take over-the-counter or prescription medicines for pain, discomfort, or fever as directed by your caregiver.  Medications to relax muscles.  Rest. Decreasing activity can help cut down on painful muscle spasms and allow the back to heal.  Application of heat or ice to the lower back may improve  muscle spasms and soothe pain.  Brace. A special back brace, called a sacroiliac belt, can help support the joint while your back is healing.  Physical therapy can help teach comfortable positions and exercises to strengthen muscles that support the sacroiliac joint.  Cortisone injections. Injections of steroid medicine into the joint can help decrease swelling and improve pain.  Hyaluronic acid injections. This chemical improves lubrication within the sacroiliac joint, thereby decreasing pain.  Radiofrequency ablation. A special needle is placed into the joint, where it burns away nerves that are carrying pain messages from the joint.  Surgery. Because pain occurs during movement of the joint, screws and plates may be installed in order to limit or prevent joint motion. HOME CARE INSTRUCTIONS   Take all medications exactly as directed.  Follow instructions regarding both rest and physical activity, to avoid worsening the pain.  Do physical therapy exercises exactly as prescribed. SEEK IMMEDIATE MEDICAL CARE IF:  You experience increasingly severe pain.  You develop new symptoms, such as numbness or tingling in your legs or feet.  You lose bladder or bowel control. Document Released: 03/28/2008 Document Revised: 03/24/2011 Document Reviewed: 03/28/2008 [facility] Patient Information 2014 [place], [place].

## 2013-06-24 NOTE — ED Provider Notes (Signed)
Medical screening examination/treatment/procedure(s) were performed by a resident physician and as supervising physician I was immediately available for consultation/collaboration.  Leslee Homeavid Quentavious Rittenhouse, M.D.  Reuben Likesavid C Amelia Burgard, MD 06/24/13 [id number]

## 2013-06-28 ENCOUNTER — Ambulatory Visit (INDEPENDENT_AMBULATORY_CARE_PROVIDER_SITE_OTHER): Payer: No Typology Code available for payment source | Admitting: Psychiatry

## 2013-06-28 DIAGNOSIS — F331 Major depressive disorder, recurrent, moderate: Secondary | ICD-10-CM

## 2013-06-28 DIAGNOSIS — F6389 Other impulse disorders: Secondary | ICD-10-CM

## 2013-06-28 DIAGNOSIS — F988 Other specified behavioral and emotional disorders with onset usually occurring in childhood and adolescence: Secondary | ICD-10-CM

## 2013-06-28 DIAGNOSIS — F4310 Post-traumatic stress disorder, unspecified: Secondary | ICD-10-CM

## 2013-07-05 ENCOUNTER — Ambulatory Visit (INDEPENDENT_AMBULATORY_CARE_PROVIDER_SITE_OTHER): Payer: No Typology Code available for payment source | Admitting: Psychiatry

## 2013-07-05 DIAGNOSIS — F4310 Post-traumatic stress disorder, unspecified: Secondary | ICD-10-CM

## 2013-07-05 DIAGNOSIS — F6389 Other impulse disorders: Secondary | ICD-10-CM

## 2013-07-05 DIAGNOSIS — F331 Major depressive disorder, recurrent, moderate: Secondary | ICD-10-CM

## 2013-07-05 DIAGNOSIS — F988 Other specified behavioral and emotional disorders with onset usually occurring in childhood and adolescence: Secondary | ICD-10-CM

## 2013-07-19 ENCOUNTER — Ambulatory Visit (INDEPENDENT_AMBULATORY_CARE_PROVIDER_SITE_OTHER): Payer: No Typology Code available for payment source | Admitting: Psychiatry

## 2013-07-19 DIAGNOSIS — F6389 Other impulse disorders: Secondary | ICD-10-CM

## 2013-07-19 DIAGNOSIS — F988 Other specified behavioral and emotional disorders with onset usually occurring in childhood and adolescence: Secondary | ICD-10-CM

## 2013-07-19 DIAGNOSIS — F331 Major depressive disorder, recurrent, moderate: Secondary | ICD-10-CM

## 2013-07-19 DIAGNOSIS — F4310 Post-traumatic stress disorder, unspecified: Secondary | ICD-10-CM

## 2013-07-26 ENCOUNTER — Ambulatory Visit (INDEPENDENT_AMBULATORY_CARE_PROVIDER_SITE_OTHER): Payer: No Typology Code available for payment source | Admitting: Psychiatry

## 2013-07-26 DIAGNOSIS — F4310 Post-traumatic stress disorder, unspecified: Secondary | ICD-10-CM

## 2013-07-26 DIAGNOSIS — F6389 Other impulse disorders: Secondary | ICD-10-CM

## 2013-07-26 DIAGNOSIS — F331 Major depressive disorder, recurrent, moderate: Secondary | ICD-10-CM

## 2013-07-26 DIAGNOSIS — F988 Other specified behavioral and emotional disorders with onset usually occurring in childhood and adolescence: Secondary | ICD-10-CM

## 2013-08-02 ENCOUNTER — Ambulatory Visit (INDEPENDENT_AMBULATORY_CARE_PROVIDER_SITE_OTHER): Payer: No Typology Code available for payment source | Admitting: Psychiatry

## 2013-08-02 DIAGNOSIS — F988 Other specified behavioral and emotional disorders with onset usually occurring in childhood and adolescence: Secondary | ICD-10-CM

## 2013-08-02 DIAGNOSIS — F6389 Other impulse disorders: Secondary | ICD-10-CM

## 2013-08-02 DIAGNOSIS — F331 Major depressive disorder, recurrent, moderate: Secondary | ICD-10-CM

## 2013-08-02 DIAGNOSIS — F4310 Post-traumatic stress disorder, unspecified: Secondary | ICD-10-CM

## 2013-08-09 ENCOUNTER — Ambulatory Visit: Payer: No Typology Code available for payment source | Admitting: Psychiatry

## 2013-08-10 ENCOUNTER — Ambulatory Visit (HOSPITAL_COMMUNITY)
Admission: RE | Admit: 2013-08-10 | Discharge: 2013-08-10 | Disposition: A | Discharge status: Home / Self Care | Payer: No Typology Code available for payment source | Source: Ambulatory Visit | Attending: Obstetrics & Gynecology | Admitting: Obstetrics & Gynecology

## 2013-08-10 DIAGNOSIS — Z803 Family history of malignant neoplasm of breast: Secondary | ICD-10-CM

## 2013-08-16 ENCOUNTER — Ambulatory Visit: Payer: No Typology Code available for payment source | Admitting: Psychiatry

## 2013-08-16 ENCOUNTER — Ambulatory Visit (INDEPENDENT_AMBULATORY_CARE_PROVIDER_SITE_OTHER): Payer: No Typology Code available for payment source | Admitting: Family Medicine

## 2013-08-16 VITALS — BP 104/68 | HR 63 | Temp 98.0°F | Resp 14 | Ht 62.0 in | Wt 105.0 lb

## 2013-08-16 DIAGNOSIS — B009 Herpesviral infection, unspecified: Secondary | ICD-10-CM | POA: Insufficient documentation

## 2013-08-16 DIAGNOSIS — Z0000 Encounter for general adult medical examination without abnormal findings: Secondary | ICD-10-CM

## 2013-08-16 DIAGNOSIS — F4321 Adjustment disorder with depressed mood: Secondary | ICD-10-CM | POA: Insufficient documentation

## 2013-08-16 MED ORDER — ACYCLOVIR 400 MG PO TABS: 400.0000 mg | ORAL_TABLET | Freq: Two times a day (BID) | ORAL | Status: DC

## 2013-08-16 NOTE — Progress Notes (Signed)
Subjective:    Patient ID: Kaitlyn Franco, female    DOB: [birth year], 38 y.o.   MRN: [id number]  HPI Patient is in office to establish care. She states that she was followed by [facility] prior to establishing care.   Patient is complaining of herpes simplex type I. She reports that she has frequent oral herpes lesions. She states that she was previously on Valtrex for suppression, but unable to maintain treatments due to healthcare insurance constraints. She states that current lesion has been present for 2 weeks. She did not attempt OTC or home interventions to alleviate symptoms. She states that lesion burns with certain food choices. She denies fever, myalgias, nausea, vomiting, or diarrhea.  Past Medical History  Diagnosis Date  . Seasonal allergies    Review of Systems  Constitutional: Negative.  Negative for fatigue.  HENT: Negative.   Eyes: Negative.   Respiratory: Negative.   Cardiovascular: Negative.   Gastrointestinal: Negative.   Endocrine: Negative.   Genitourinary: Negative.   Skin: Negative.        Lesion to lower lip  Allergic/Immunologic: Positive for environmental allergies.  Neurological: Negative.   Hematological: Negative.   Psychiatric/Behavioral: Negative.        Situational depression       Objective:   Physical Exam  Nursing note and vitals reviewed. Constitutional: She is oriented to person, place, and time. She appears well-developed and well-nourished.  HENT:  Head: Normocephalic and atraumatic.  Right Ear: External ear normal.  Left Ear: External ear normal.  Mouth/Throat: Oropharynx is clear and moist.  Eyes: Conjunctivae are normal. Pupils are equal, round, and reactive to light.  Neck: Normal range of motion. Neck supple.  Cardiovascular: Normal rate, regular rhythm and normal heart sounds.   Pulmonary/Chest: Effort normal and breath sounds normal.  Abdominal: Soft. Bowel sounds are normal.  Musculoskeletal: Normal range of motion.   Neurological: She is alert and oriented to person, place, and time. She has normal reflexes.  Skin: Skin is warm, dry and intact. Rash noted. There is pallor.     Psychiatric: She has a normal mood and affect. Her behavior is normal. Judgment and thought content normal.         BP 104/68  Pulse 63  Temp(Src) 98 F (36.7 C) (Oral)  Resp 14  Ht 5\' 2"  (1.575 m)  Wt 105 lb (47.628 kg)  BMI 19.20 kg/m2  LMP 08/15/2013 Assessment & Plan:  1. Herpes Simplex: Type I Acyclovir. Will re-start  Acyclovir therapy twice daily  Meds ordered this encounter  Medications  . DISCONTD: acyclovir (ZOVIRAX) 400 MG tablet    Sig: Take 1 tablet (400 mg total) by mouth 2 (two) times daily.    Dispense:  60 tablet    Refill:  5    Order Specific Question:  Supervising Provider    Answer:  MATTHEWS, MICHELLE A [[id number]]  . acyclovir (ZOVIRAX) 400 MG tablet    Sig: Take 1 tablet (400 mg total) by mouth 2 (two) times daily.    Dispense:  60 tablet    Refill:  5    Order Specific Question:  Supervising Provider    Answer:  MATTHEWS, MICHELLE A [[id number]]   2. Situational depression: She states that she is depressed at times due to financial situation. She states that she manages depression. Ms. Mazo denies suicidal or homicidal ideations. She is very reserved on this topic and states that she does not want to be labeled with depression.  3. Environmental allergies: She states that she has allergies during the spring. Allergy symptoms are controlled by OTC antihistamines.    Last eye exam: 1 year ago   Last pap smear: last year, was normal  Non smoker Non drinker Exercise rarely Diet: balanced diet, 6-8 glasses of water Vaccinations: Up to date on Tdap LMP: 08/13/2013 No children Sexually active: Monogamous

## 2013-08-18 ENCOUNTER — Telehealth: Payer: Self-pay | Admitting: Internal Medicine

## 2013-08-18 NOTE — Telephone Encounter (Signed)
Needs referral to [facility] .

## 2013-08-18 NOTE — Telephone Encounter (Signed)
Armeniahina,  This patient is requesting a referral for a Dental Visit. Is this ok? She was just seen 08/16/2013. Please advise. Thanks!

## 2013-08-18 NOTE — Telephone Encounter (Signed)
[facility]

## 2013-08-23 ENCOUNTER — Ambulatory Visit: Payer: No Typology Code available for payment source | Admitting: Psychiatry

## 2013-08-24 ENCOUNTER — Encounter: Payer: Self-pay | Admitting: Family Medicine

## 2013-08-30 ENCOUNTER — Ambulatory Visit: Payer: No Typology Code available for payment source | Admitting: Psychiatry

## 2013-08-31 ENCOUNTER — Other Ambulatory Visit (HOSPITAL_COMMUNITY)
Admission: RE | Admit: 2013-08-31 | Discharge: 2013-08-31 | Disposition: A | Discharge status: Home / Self Care | Payer: No Typology Code available for payment source | Source: Ambulatory Visit | Attending: Family Medicine | Admitting: Family Medicine

## 2013-08-31 ENCOUNTER — Ambulatory Visit (INDEPENDENT_AMBULATORY_CARE_PROVIDER_SITE_OTHER): Payer: No Typology Code available for payment source | Admitting: Family Medicine

## 2013-08-31 VITALS — BP 111/71 | HR 74 | Temp 98.1°F | Resp 14 | Ht 62.0 in | Wt 107.0 lb

## 2013-08-31 DIAGNOSIS — F4321 Adjustment disorder with depressed mood: Secondary | ICD-10-CM

## 2013-08-31 DIAGNOSIS — N898 Other specified noninflammatory disorders of vagina: Secondary | ICD-10-CM

## 2013-08-31 DIAGNOSIS — B009 Herpesviral infection, unspecified: Secondary | ICD-10-CM

## 2013-08-31 DIAGNOSIS — Z8619 Personal history of other infectious and parasitic diseases: Secondary | ICD-10-CM

## 2013-08-31 DIAGNOSIS — Z01419 Encounter for gynecological examination (general) (routine) without abnormal findings: Secondary | ICD-10-CM | POA: Insufficient documentation

## 2013-08-31 NOTE — Progress Notes (Signed)
   Subjective:    Patent ID: Kaitlyn Franco, female    DOB: 04/15/1975, 38 y.o.   MRN: [id number]  HPI Patient reports that she has a vaginal lesion. 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 She reports that [facility] years [facility],which was normal.    Patient complains of an abnormal vaginal lesion for 1 month. Vaginal symptoms include lesions.Patient denies vaginal discharge, itching, abnormal bleeding, blisters, burning, or painful intercourse. She is currently monogamous and does not use contraceptives .Menstrual pattern is normal.    Review of Systems  Constitutional: Negative.   HENT: Negative.   Eyes: Negative.   Respiratory: Negative.   Cardiovascular: Negative.   Gastrointestinal: Negative.   Endocrine: Negative.   Genitourinary: Negative.  Negative for vaginal pain (vaginal lesion).  Musculoskeletal: Negative.   Skin: Negative.   Allergic/Immunologic: Negative.   Neurological: Negative.   Psychiatric/Behavioral:       Reports situational depression. Denies suicidal or homicidal ideations.        Objective:   Physical Exam  Constitutional: She is oriented to person, place, and time. She appears well-developed and well-nourished.  HENT:  Head: Normocephalic and atraumatic.  Right Ear: External ear normal.  Mouth/Throat: Oropharynx is clear and moist.  Eyes: Pupils are equal, round, and reactive to light.  Neck: Normal range of motion. Neck supple.  Cardiovascular: Normal rate, regular rhythm, normal heart sounds and intact distal pulses.   Pulmonary/Chest: Effort normal and breath sounds normal.  Abdominal: Soft. Bowel sounds are normal.  Genitourinary:     Musculoskeletal: Normal range of motion.  Neurological: She is alert and oriented to person, place, and time. She has normal reflexes.  Skin: Skin is warm and dry.  Psychiatric: She has a normal mood and affect. Her behavior is normal. Judgment and thought content  normal.         BP 111/71  Pulse 74  Temp(Src) 98.1 F (36.7 C) (Oral)  Resp 14  Ht 5\' 2"  (1.575 Kaitlyn Franco)  Wt 107 lb (48.535 kg)  BMI 19.57 kg/m2  LMP 08/15/2013 Assessment & Plan:   1. Vaginal lesion - Vaginal lesion to left labia minora. Patient currently denies pain and palpitations. Cytology - PAP [place]  2. History of HPV infection - Cytology - PAP [place]  3. Herpes simplex Continue Acyclovir daily  4. Situational depression Ms. [name] states that situational depression has improved. She currently denies suicidal or homicidal ideations.    Preventative care:  Patient is up to date on vaccinations RTC: 6 months for CPE  Kaitlyn Franco,Kaitlyn Franco Kaitlyn Franco, Kaitlyn Franco

## 2013-09-04 ENCOUNTER — Encounter: Payer: Self-pay | Admitting: Family Medicine

## 2013-09-05 ENCOUNTER — Ambulatory Visit: Payer: No Typology Code available for payment source | Admitting: Internal Medicine

## 2013-09-06 ENCOUNTER — Ambulatory Visit: Payer: No Typology Code available for payment source | Admitting: Psychiatry

## 2013-09-07 LAB — CYTOLOGY - PAP

## 2013-09-20 ENCOUNTER — Telehealth: Payer: Self-pay | Admitting: Internal Medicine

## 2013-09-20 NOTE — Telephone Encounter (Signed)
Patient would like results from last set of  labs and needs dental referral.

## 2013-09-26 ENCOUNTER — Ambulatory Visit (INDEPENDENT_AMBULATORY_CARE_PROVIDER_SITE_OTHER): Payer: No Typology Code available for payment source | Admitting: Family Medicine

## 2013-09-26 ENCOUNTER — Encounter: Payer: Self-pay | Admitting: Family Medicine

## 2013-09-26 VITALS — BP 111/66 | HR 67 | Temp 98.0°F | Resp 16 | Ht 62.0 in | Wt 108.0 lb

## 2013-09-26 DIAGNOSIS — M545 Low back pain, unspecified: Secondary | ICD-10-CM | POA: Insufficient documentation

## 2013-09-26 DIAGNOSIS — M25569 Pain in unspecified knee: Secondary | ICD-10-CM

## 2013-09-26 DIAGNOSIS — M25562 Pain in left knee: Secondary | ICD-10-CM

## 2013-09-26 NOTE — Patient Instructions (Signed)
Knee Pain Knee pain can be a result of an injury or other medical conditions. Treatment will depend on the cause of your pain. HOME CARE  Only take medicine as told by your doctor.  Keep a healthy weight. Being overweight can make the knee hurt more.  Stretch before exercising or playing sports.  If there is constant knee pain, change the way you exercise. Ask your doctor for advice.  Make sure shoes fit well. Choose the right shoe for the sport or activity.  Protect your knees. Wear kneepads if needed.  Rest when you are tired. GET HELP RIGHT AWAY IF:   Your knee pain does not stop.  Your knee pain does not get better.  Your knee joint feels hot to the touch.  You have a fever. MAKE SURE YOU:   Understand these instructions.  Will watch this condition.  Will get help right away if you are not doing well or get worse. Document Released: 03/28/2008 Document Revised: 03/24/2011 Document Reviewed: 03/28/2008 [facility] Patient Information 2015 [place], [place]. This information is not intended to replace advice given to you by your health care provider. Make sure you discuss any questions you have with your health care provider. Back Injury Prevention Back injuries can be extremely painful and difficult to heal. After having one back injury, you are much more likely to experience another later on. It is important to learn how to avoid injuring or re-injuring your back. The following tips can help you to prevent a back injury. PHYSICAL FITNESS  Exercise regularly and try to develop good tone in your abdominal muscles. Your abdominal muscles provide a lot of the support needed by your back.  Do aerobic exercises (walking, jogging, biking, swimming) regularly.  Do exercises that increase balance and strength (tai chi, yoga) regularly. This can decrease your risk of falling and injuring your back.  Stretch before and after exercising.  Maintain a healthy weight. The more you  weigh, the more stress is placed on your back. For every pound of weight, 10 times that amount of pressure is placed on the back. DIET  Talk to your caregiver about how much calcium and vitamin D you need per day. These nutrients help to prevent weakening of the bones (osteoporosis). Osteoporosis can cause broken (fractured) bones that lead to back pain.  Include good sources of calcium in your diet, such as dairy products, green, leafy vegetables, and products with calcium added (fortified).  Include good sources of vitamin D in your diet, such as milk and foods that are fortified with vitamin D.  Consider taking a nutritional supplement or a multivitamin if needed.  Stop smoking if you smoke. POSTURE  Sit and stand up straight. Avoid leaning forward when you sit or hunching over when you stand.  Choose chairs with good low back (lumbar) support.  If you work at a desk, sit close to your work so you do not need to lean over. Keep your chin tucked in. Keep your neck drawn back and elbows bent at a right angle. Your arms should look like the letter "L."  Sit high and close to the steering wheel when you drive. Add a lumbar support to your car seat if needed.  Avoid sitting or standing in one position for too long. Take breaks to get up, stretch, and walk around at least once every hour. Take breaks if you are driving for long periods of time.  Sleep on your side with your knees slightly bent, or  sleep on your back with a pillow under your knees. Do not sleep on your stomach. LIFTING, TWISTING, AND REACHING  Avoid heavy lifting, especially repetitive lifting. If you must do heavy lifting:  Stretch before lifting.  Work slowly.  Rest between lifts.  Use carts and dollies to move objects when possible.  Make several small trips instead of carrying 1 heavy load.  Ask for help when you need it.  Ask for help when moving big, awkward objects.  Follow these steps when  lifting:  Stand with your feet shoulder-width apart.  Get as close to the object as you can. Do not try to pick up heavy objects that are far from your body.  Use handles or lifting straps if they are available.  Bend at your knees. Squat down, but keep your heels off the floor.  Keep your shoulders pulled back, your chin tucked in, and your back straight.  Lift the object slowly, tightening the muscles in your legs, abdomen, and buttocks. Keep the object as close to the center of your body as possible.  When you put a load down, use these same guidelines in reverse.  Do not:  Lift the object above your waist.  Twist at the waist while lifting or carrying a load. Move your feet if you need to turn, not your waist.  Bend over without bending at your knees.  Avoid reaching over your head, across a table, or for an object on a high surface. OTHER TIPS  Avoid wet floors and keep sidewalks clear of ice to prevent falls.  Do not sleep on a mattress that is too soft or too hard.  Keep items that are used frequently within easy reach.  Put heavier objects on shelves at waist level and lighter objects on lower or higher shelves.  Find ways to decrease your stress, such as exercise, massage, or relaxation techniques. Stress can build up in your muscles. Tense muscles are more vulnerable to injury.  Seek treatment for depression or anxiety if needed. These conditions can increase your risk of developing back pain. SEEK MEDICAL CARE IF:  You injure your back.  You have questions about diet, exercise, or other ways to prevent back injuries. MAKE SURE YOU:  Understand these instructions.  Will watch your condition.  Will get help right away if you are not doing well or get worse. Document Released: 02/07/2004 Document Revised: 03/24/2011 Document Reviewed: 02/10/2011 [facility] Patient Information 2015 [place], [place]. This information is not intended to replace advice given to you  by your health care provider. Make sure you discuss any questions you have with your health care provider. Back Pain, Adult Back pain is very common. The pain often gets better over time. The cause of back pain is usually not dangerous. Most people can learn to manage their back pain on their own.  HOME CARE   Stay active. Start with short walks on flat ground if you can. Try to walk farther each day.  Do not sit, drive, or stand in one place for more than 30 minutes. Do not stay in bed.  Do not avoid exercise or work. Activity can help your back heal faster.  Be careful when you bend or lift an object. Bend at your knees, keep the object close to you, and do not twist.  Sleep on a firm mattress. Lie on your side, and bend your knees. If you lie on your back, put a pillow under your knees.  Only take medicines  as told by your doctor.  Put ice on the injured area.  Put ice in a plastic bag.  Place a towel between your skin and the bag.  Leave the ice on for 15-20 minutes, 03-04 times a day for the first 2 to 3 days. After that, you can switch between ice and heat packs.  Ask your doctor about back exercises or massage.  Avoid feeling anxious or stressed. Find good ways to deal with stress, such as exercise. GET HELP RIGHT AWAY IF:   Your pain does not go away with rest or medicine.  Your pain does not go away in 1 week.  You have new problems.  You do not feel well.  The pain spreads into your legs.  You cannot control when you poop (bowel movement) or pee (urinate).  Your arms or legs feel weak or lose feeling (numbness).  You feel sick to your stomach (nauseous) or throw up (vomit).  You have belly (abdominal) pain.  You feel like you may pass out (faint). MAKE SURE YOU:   Understand these instructions.  Will watch your condition.  Will get help right away if you are not doing well or get worse. Document Released: 06/18/2007 Document Revised: 03/24/2011  Document Reviewed: 05/03/2013 [facility] Patient Information 2015 [place], [place]. This information is not intended to replace advice given to you by your health care provider. Make sure you discuss any questions you have with your health care provider.

## 2013-09-26 NOTE — Progress Notes (Signed)
   Subjective:    Patient ID: Kaitlyn Franco, female    DOB: 1975/07/05, 38 y.o.   MRN: [id number]  HPI  Patient presents with occasional knee discomfort involving the  left knee. She states that discomfort occurs primarily with sitting and standing. Onset of the symptoms was several months ago. Current symptoms include popping sensation and stiffness. Pain is aggravated by kneeling and squatting.  Patient has had no prior knee problems.   Kaitlyn Franco also reports periodic low back pain with prolonged sitting. She states that she currently does not have pain. She states that discomfort is relieved by stretching the back. She has not attempted OTC interventions to alleviate back pain.    Review of Systems  Constitutional: Negative.   HENT: Negative.   Eyes: Negative.   Respiratory: Negative.   Cardiovascular: Negative.   Gastrointestinal: Negative.   Endocrine: Negative.   Genitourinary: Negative.   Musculoskeletal: Positive for back pain. Negative for neck pain. Joint swelling: left knee discomfort.  Allergic/Immunologic: Negative.   Neurological: Negative.   Hematological: Negative.   Psychiatric/Behavioral: Negative.        Objective:   Physical Exam  Constitutional: She is oriented to person, place, and time. She appears well-developed and well-nourished.  HENT:  Head: Normocephalic and atraumatic.  Right Ear: External ear normal.  Left Ear: External ear normal.  Mouth/Throat: Oropharynx is clear and moist.  Eyes: Pupils are equal, round, and reactive to light.  Cardiovascular: Normal rate and regular rhythm.   Abdominal: Soft. Bowel sounds are normal.  Musculoskeletal:       Right knee: She exhibits normal range of motion and no ecchymosis. No tenderness found. No medial joint line, no lateral joint line, no MCL, no LCL and no patellar tendon tenderness noted.       Left knee: She exhibits normal range of motion, no swelling and no ecchymosis. No tenderness found. No medial  joint line, no lateral joint line, no MCL, no LCL and no patellar tendon tenderness noted.  Neurological: She is alert and oriented to person, place, and time. She has normal reflexes.  Skin: Skin is warm and dry.  Psychiatric: She has a normal mood and affect. Her behavior is normal. Judgment and thought content normal.         BP 111/66  Pulse 67  Temp(Src) 98 F (36.7 C) (Oral)  Resp 16  Ht  (1.575 m)  Wt 108 lb (48.988 kg)  BMI 19.75 kg/m2 Assessment & Plan:  1. Left knee pain She is currently not having pain to left knee. Patient has 5/5 strength and has a benign physical examination. Provided a series knee stretches. Also recommend that patient apply ice to knee for 20 minutes 4 times per day as needed. Also, apply heat for 20 minutes 4 times per day as needed.   2. Midline low back pain without sciatica -Patient was given written materials on back stretches. Also recommend starting a routine exercise routine including.    Hollis,Lachina M, FNP RTC: as previously scheduled

## 2013-12-30 ENCOUNTER — Emergency Department (INDEPENDENT_AMBULATORY_CARE_PROVIDER_SITE_OTHER)
Admission: EM | Admit: 2013-12-30 | Discharge: 2013-12-30 | Disposition: A | Discharge status: Home / Self Care | Payer: Self-pay | Source: Home / Self Care | Attending: Emergency Medicine | Admitting: Emergency Medicine

## 2013-12-30 ENCOUNTER — Encounter (HOSPITAL_COMMUNITY): Payer: Self-pay | Admitting: Emergency Medicine

## 2013-12-30 DIAGNOSIS — R5381 Other malaise: Secondary | ICD-10-CM

## 2013-12-30 DIAGNOSIS — R5383 Other fatigue: Secondary | ICD-10-CM

## 2013-12-30 DIAGNOSIS — R531 Weakness: Secondary | ICD-10-CM

## 2013-12-30 LAB — POCT I-STAT, CHEM 8
BUN: 12 mg/dL (ref 6–23)
CHLORIDE: 104 meq/L (ref 96–112)
CREATININE: 0.9 mg/dL (ref 0.50–1.10)
Calcium, Ion: 1.12 mmol/L (ref 1.12–1.23)
Glucose, Bld: 85 mg/dL (ref 70–99)
HCT: 51 % — ABNORMAL HIGH (ref 36.0–46.0)
HEMOGLOBIN: 17.3 g/dL — AB (ref 12.0–15.0)
Potassium: 4.2 mEq/L (ref 3.7–5.3)
SODIUM: 139 meq/L (ref 137–147)
TCO2: 23 mmol/L (ref 0–100)

## 2013-12-30 LAB — POCT URINALYSIS DIP (DEVICE)
Glucose, UA: NEGATIVE mg/dL
Ketones, ur: 40 mg/dL — AB
LEUKOCYTES UA: NEGATIVE
Nitrite: NEGATIVE
Protein, ur: NEGATIVE mg/dL
Specific Gravity, Urine: 1.025 (ref 1.005–1.030)
Urobilinogen, UA: 0.2 mg/dL (ref 0.0–1.0)
pH: 6 (ref 5.0–8.0)

## 2013-12-30 NOTE — ED Notes (Signed)
Patient states she has had a recent change in diet to veganism. Patient c/o feeling weak, lack of appetite and headaches. Patient reports she has felt dizzy. She also has just started her menstrual cycle yesterday and felt so weak like she had to be carried. Patient is in NAD.

## 2013-12-30 NOTE — Discharge Instructions (Signed)
Fatigue °Fatigue is a feeling of tiredness, lack of energy, lack of motivation, or feeling tired all the time. Having enough rest, good nutrition, and reducing stress will normally reduce fatigue. Consult your caregiver if it persists. The nature of your fatigue will help your caregiver to find out its cause. The treatment is based on the cause.  °CAUSES  °There are many causes for fatigue. Most of the time, fatigue can be traced to one or more of your habits or routines. Most causes fit into one or more of three general areas. They are: °Lifestyle problems °· Sleep disturbances. °· Overwork. °· Physical exertion. °· Unhealthy habits. °· Poor eating habits or eating disorders. °· Alcohol and/or drug use . °· Lack of proper nutrition (malnutrition). °Psychological problems °· Stress and/or anxiety problems. °· Depression. °· Grief. °· Boredom. °Medical Problems or Conditions °· Anemia. °· Pregnancy. °· Thyroid gland problems. °· Recovery from major surgery. °· Continuous pain. °· Emphysema or asthma that is not well controlled °· Allergic conditions. °· Diabetes. °· Infections (such as mononucleosis). °· Obesity. °· Sleep disorders, such as sleep apnea. °· Heart failure or other heart-related problems. °· Cancer. °· Kidney disease. °· Liver disease. °· Effects of certain medicines such as antihistamines, cough and cold remedies, prescription pain medicines, heart and blood pressure medicines, drugs used for treatment of cancer, and some antidepressants. °SYMPTOMS  °The symptoms of fatigue include:  °· Lack of energy. °· Lack of drive (motivation). °· Drowsiness. °· Feeling of indifference to the surroundings. °DIAGNOSIS  °The details of how you feel help guide your caregiver in finding out what is causing the fatigue. You will be asked about your present and past health condition. It is important to review all medicines that you take, including prescription and non-prescription items. A thorough exam will be done.  You will be questioned about your feelings, habits, and normal lifestyle. Your caregiver may suggest blood tests, urine tests, or other tests to look for common medical causes of fatigue.  °TREATMENT  °Fatigue is treated by correcting the underlying cause. For example, if you have continuous pain or depression, treating these causes will improve how you feel. Similarly, adjusting the dose of certain medicines will help in reducing fatigue.  °HOME CARE INSTRUCTIONS  °· Try to get the required amount of good sleep every night. °· Eat a healthy and nutritious diet, and drink enough water throughout the day. °· Practice ways of relaxing (including yoga or meditation). °· Exercise regularly. °· Make plans to change situations that cause stress. Act on those plans so that stresses decrease over time. Keep your work and personal routine reasonable. °· Avoid street drugs and minimize use of alcohol. °· Start taking a daily multivitamin after consulting your caregiver. °SEEK MEDICAL CARE IF:  °· You have persistent tiredness, which cannot be accounted for. °· You have fever. °· You have unintentional weight loss. °· You have headaches. °· You have disturbed sleep throughout the night. °· You are feeling sad. °· You have constipation. °· You have dry skin. °· You have gained weight. °· You are taking any new or different medicines that you suspect are causing fatigue. °· You are unable to sleep at night. °· You develop any unusual swelling of your legs or other parts of your body. °SEEK IMMEDIATE MEDICAL CARE IF:  °· You are feeling confused. °· Your vision is blurred. °· You feel faint or pass out. °· You develop severe headache. °· You develop severe abdominal, pelvic, or   back pain. °· You develop chest pain, shortness of breath, or an irregular or fast heartbeat. °· You are unable to pass a normal amount of urine. °· You develop abnormal bleeding such as bleeding from the rectum or you vomit blood. °· You have thoughts  about harming yourself or committing suicide. °· You are worried that you might harm someone else. °MAKE SURE YOU:  °· Understand these instructions. °· Will watch your condition. °· Will get help right away if you are not doing well or get worse. °Document Released: 10/27/2006 Document Revised: 03/24/2011 Document Reviewed: 05/03/2013 °[employer]® Patient Information ©2015 [employer]. This information is not intended to replace advice given to you by your health care provider. Make sure you discuss any questions you have with your health care provider. °Weakness °Weakness is a lack of strength. It may be felt all over the body (generalized) or in one specific part of the body (focal). Some causes of weakness can be serious. You may need further medical evaluation, especially if you are elderly or you have a history of immunosuppression (such as chemotherapy or HIV), kidney disease, heart disease, or diabetes. °CAUSES  °Weakness can be caused by many different things, including: °· Infection. °· Physical exhaustion. °· Internal bleeding or other blood loss that results in a lack of red blood cells (anemia). °· Dehydration. This cause is more common in elderly people. °· Side effects or electrolyte abnormalities from medicines, such as pain medicines or sedatives. °· Emotional distress, anxiety, or depression. °· Circulation problems, especially severe peripheral arterial disease. °· Heart disease, such as rapid atrial fibrillation, bradycardia, or heart failure. °· Nervous system disorders, such as Guillain-Barré syndrome, multiple sclerosis, or stroke. °DIAGNOSIS  °To find the cause of your weakness, your caregiver will take your history and perform a physical exam. Lab tests or X-rays may also be ordered, if needed. °TREATMENT  °Treatment of weakness depends on the cause of your symptoms and can vary greatly. °HOME CARE INSTRUCTIONS  °· Rest as needed. °· Eat a well-balanced diet. °· Try to get some exercise  every day. °· Only take over-the-counter or prescription medicines as directed by your caregiver. °SEEK MEDICAL CARE IF:  °· Your weakness seems to be getting worse or spreads to other parts of your body. °· You develop new aches or pains. °SEEK IMMEDIATE MEDICAL CARE IF:  °· You cannot perform your normal daily activities, such as getting dressed and feeding yourself. °· You cannot walk up and down stairs, or you feel exhausted when you do so. °· You have shortness of breath or chest pain. °· You have difficulty moving parts of your body. °· You have weakness in only one area of the body or on only one side of the body. °· You have a fever. °· You have trouble speaking or swallowing. °· You cannot control your bladder or bowel movements. °· You have black or bloody vomit or stools. °MAKE SURE YOU: °· Understand these instructions. °· Will watch your condition. °· Will get help right away if you are not doing well or get worse. °Document Released: 12/30/2004 Document Revised: 07/01/2011 Document Reviewed: 02/28/2011 °[employer]® Patient Information ©2015 [employer]. This information is not intended to replace advice given to you by your health care provider. Make sure you discuss any questions you have with your health care provider. ° °

## 2013-12-30 NOTE — ED Provider Notes (Signed)
CSN: [id number]     Arrival date & time 12/30/13  1750 History   First MD Initiated Contact with Patient 12/30/13 1804     Chief Complaint  Patient presents with  . Fatigue   (Consider location/radiation/quality/duration/timing/severity/associated sxs/prior Treatment) HPI Comments: 38 year old female is complaining of weakness, dizziness and a sensation of impending faintness. This began yesterday and continues today. She is also had a decreased appetite today. Approximately 3 weeks ago she changed her diet to a vegan type. She denies specific symptoms such as fever, sore throat, earache, abdominal pain, nausea, vomiting, diarrhea. LMP began yesterday. She has a history of anxiety and depression but denies those symptoms at this time. Her affect is somewhat flat   Past Medical History  Diagnosis Date  . Seasonal allergies    History reviewed. No pertinent past surgical history. Family History  Problem Relation Age of Onset  . Alcohol abuse Father    History  Substance Use Topics  . Smoking status: Never Smoker   . Smokeless tobacco: Not on file  . Alcohol Use: No   OB History    No data available     Review of Systems  Constitutional: Positive for activity change, appetite change and fatigue. Negative for fever and unexpected weight change.  Eyes: Negative.   Respiratory: Negative for cough, chest tightness, shortness of breath and wheezing.   Cardiovascular: Negative for chest pain, palpitations and leg swelling.  Gastrointestinal: Negative for nausea, vomiting, abdominal pain, diarrhea, constipation and blood in stool.  Endocrine: Negative for polydipsia, polyphagia and polyuria.  Genitourinary: Negative.   Musculoskeletal: Negative for myalgias, back pain and neck pain.  Skin: Positive for pallor. Negative for rash.  Neurological: Positive for light-headedness and headaches. Negative for tremors, syncope, speech difficulty and numbness.    Allergies  Mold  extract  Home Medications   Prior to Admission medications   Medication Sig Start Date End Date Taking? Authorizing Provider  acyclovir (ZOVIRAX) 400 MG tablet Take 1 tablet (400 mg total) by mouth 2 (two) times daily. 08/16/13   Massie MaroonLachina M Hollis, FNP   BP 113/73 mmHg  Pulse 62  Temp(Src) 98.1 F (36.7 C) (Oral)  Resp 16  SpO2 99%  LMP 12/29/2013 Physical Exam  Constitutional: She is oriented to person, place, and time. She appears well-developed and well-nourished. No distress.  Very thin body habitus, not cachectic.  HENT:  Head: Normocephalic and atraumatic.  Mouth/Throat: Oropharynx is clear and moist. No oropharyngeal exudate.  Eyes: EOM are normal. Pupils are equal, round, and reactive to light.  Neck: Normal range of motion. Neck supple.  Cardiovascular: Normal rate and normal heart sounds.   Pulmonary/Chest: Effort normal and breath sounds normal. No respiratory distress.  Abdominal: Soft. There is no tenderness.  Musculoskeletal: Normal range of motion.  Neurological: She is alert and oriented to person, place, and time. No cranial nerve deficit.  Skin: Skin is warm and dry.  Psychiatric: She has a normal mood and affect.  Nursing note and vitals reviewed.   ED Course  Procedures (including critical care time) Labs Review Labs Reviewed  POCT I-STAT, CHEM 8 - Abnormal; Notable for the following:    Hemoglobin 17.3 (*)    HCT 51.0 (*)    All other components within normal limits  POCT URINALYSIS DIP (DEVICE) - Abnormal; Notable for the following:    Bilirubin Urine SMALL (*)    Ketones, ur 40 (*)    Hgb urine dipstick SMALL (*)    All other  components within normal limits   Results for orders placed or performed during the hospital encounter of 12/30/13  I-STAT, chem 8  Result Value Ref Range   Sodium 139 137 - 147 mEq/L   Potassium 4.2 3.7 - 5.3 mEq/L   Chloride 104 96 - 112 mEq/L   BUN 12 6 - 23 mg/dL   Creatinine, Ser 1.610.90 0.50 - 1.10 mg/dL   Glucose,  Bld 85 70 - 99 mg/dL   Calcium, Ion 0.961.12 0.451.12 - 1.23 mmol/L   TCO2 23 0 - 100 mmol/L   Hemoglobin 17.3 (H) 12.0 - 15.0 g/dL   HCT 40.951.0 (H) 81.136.0 - 91.446.0 %  POCT urinalysis dip (device)  Result Value Ref Range   Glucose, UA NEGATIVE NEGATIVE mg/dL   Bilirubin Urine SMALL (A) NEGATIVE   Ketones, ur 40 (A) NEGATIVE mg/dL   Specific Gravity, Urine 1.025 1.005 - 1.030   Hgb urine dipstick SMALL (A) NEGATIVE   pH 6.0 5.0 - 8.0   Protein, ur NEGATIVE NEGATIVE mg/dL   Urobilinogen, UA 0.2 0.0 - 1.0 mg/dL   Nitrite NEGATIVE NEGATIVE   Leukocytes, UA NEGATIVE NEGATIVE    Imaging Review No results found.   MDM   1. Malaise and fatigue   2. Weakness    Discussed healthy eating habits including plenty of liquids and staying well-hydrated. Recommend getting sufficient amounts of protein, carbohydrates and fat. Certainly, may be a viral type syndrome which is causing your weakness. Physical exam essentially unremarkable today. Follow-up with your PCP and make appointment take with getting worse.     Hayden Rasmussenavid Tacia Hindley, NP 12/30/13 [phone]

## 2013-12-30 NOTE — ED Notes (Signed)
Patient can't void at this time 

## 2014-02-13 ENCOUNTER — Encounter: Payer: Self-pay | Admitting: Internal Medicine

## 2014-02-14 ENCOUNTER — Other Ambulatory Visit: Payer: Self-pay | Admitting: Family Medicine

## 2014-02-20 ENCOUNTER — Ambulatory Visit: Payer: Self-pay | Attending: Internal Medicine

## 2014-04-27 ENCOUNTER — Encounter (HOSPITAL_COMMUNITY): Payer: Self-pay | Admitting: Emergency Medicine

## 2014-04-27 ENCOUNTER — Emergency Department (INDEPENDENT_AMBULATORY_CARE_PROVIDER_SITE_OTHER)
Admission: EM | Admit: 2014-04-27 | Discharge: 2014-04-27 | Disposition: A | Discharge status: Home / Self Care | Payer: Self-pay | Source: Home / Self Care | Attending: Family Medicine | Admitting: Family Medicine

## 2014-04-27 DIAGNOSIS — J039 Acute tonsillitis, unspecified: Secondary | ICD-10-CM

## 2014-04-27 DIAGNOSIS — J069 Acute upper respiratory infection, unspecified: Secondary | ICD-10-CM

## 2014-04-27 DIAGNOSIS — J040 Acute laryngitis: Secondary | ICD-10-CM

## 2014-04-27 LAB — POCT INFECTIOUS MONO SCREEN: Mono Screen: NEGATIVE

## 2014-04-27 LAB — POCT RAPID STREP A: Streptococcus, Group A Screen (Direct): NEGATIVE

## 2014-04-27 MED ORDER — FLUCONAZOLE 150 MG PO TABS: 150.0000 mg | ORAL_TABLET | Freq: Every day | ORAL | Status: DC

## 2014-04-27 MED ORDER — AMOXICILLIN-POT CLAVULANATE 875-125 MG PO TABS: 1.0000 | ORAL_TABLET | Freq: Two times a day (BID) | ORAL | Status: DC

## 2014-04-27 MED ORDER — FLUTICASONE PROPIONATE 50 MCG/ACT NA SUSP: 2.0000 | Freq: Every day | NASAL | Status: DC

## 2014-04-27 MED ORDER — IPRATROPIUM BROMIDE 0.06 % NA SOLN: 2.0000 | Freq: Four times a day (QID) | NASAL | Status: AC

## 2014-04-27 MED ORDER — DEXAMETHASONE 10 MG/ML FOR PEDIATRIC ORAL USE
INTRAMUSCULAR | Status: AC
  Filled 2014-04-27: qty 1

## 2014-04-27 MED ORDER — DEXAMETHASONE 1 MG/ML PO CONC
10.0000 mg | Freq: Once | ORAL | Status: AC
  Administered 2014-04-27: 10 mg via ORAL

## 2014-04-27 NOTE — ED Notes (Signed)
C/o  Sore throat.  Pain with swallowing and speaking x 5 days.   No relief with otc meds.  Denies fever, n/v/d.

## 2014-04-27 NOTE — Discharge Instructions (Signed)
Your symptoms are suggestive of tonsillitis and laryngitis. Please set using the nasal Atrovent, Flonase, daily allergy pill such as Zyrtec, and ibuprofen 400-600 mg every 6 hours. Your strep test and mono test were negative. We will let you know if your strep culture comes back positive. Please start the antibiotics if you are not better in 24-48 hrs or if you get worse.  Please start the diflucan if you get a yeast infection

## 2014-04-27 NOTE — ED Provider Notes (Signed)
CSN: [id number]     Arrival date & time 04/27/14  1050 History   First MD Initiated Contact with Patient 04/27/14 1110     Chief Complaint  Patient presents with  . Sore Throat   (Consider location/radiation/quality/duration/timing/severity/associated sxs/prior Treatment) HPI Patient minimally vocal due to symptoms. Patient complaining of a 5 day history of very sore throat, that is getting worse. Pain is worse with coughing and swallowing. Patient was complaining of a non-productive cough and runny nose. Patient lost her voice the other day and states that it is painful to talk. History of severe recurrent tonsillitis in the past patient has not tried anything for her symptoms. Now also with some facial pain and headache. Also complaining of severe fatigue. Denies chest pain, shortness of breath, palpitations, nausea, vomiting, diarrhea, dysuria, frequency, back pain, rash. Denies fevers and chills.   Past Medical History  Diagnosis Date  . Seasonal allergies    History reviewed. No pertinent past surgical history. Family History  Problem Relation Age of Onset  . Alcohol abuse Father    History  Substance Use Topics  . Smoking status: Never Smoker   . Smokeless tobacco: Not on file  . Alcohol Use: No   OB History    No data available     Review of Systems Per HPI with all other pertinent systems negative.    Allergies  Mold extract  Home Medications   Prior to Admission medications   Medication Sig Start Date End Date Taking? Authorizing Provider  acyclovir (ZOVIRAX) 400 MG tablet TAKE 1 TABLET BY MOUTH 2 TIMES DAILY. 02/14/14  Yes Massie MaroonLachina M Hollis, FNP  amoxicillin-clavulanate (AUGMENTIN) 875-125 MG per tablet Take 1 tablet by mouth 2 (two) times daily. 04/27/14   Ozella Rocksavid J Merrell, MD  fluticasone (FLONASE) 50 MCG/ACT nasal spray Place 2 sprays into both nostrils at bedtime. 04/27/14   Ozella Rocksavid J Merrell, MD  ipratropium (ATROVENT) 0.06 % nasal spray Place 2 sprays into both  nostrils 4 (four) times daily. 04/27/14   Ozella Rocksavid J Merrell, MD   BP 100/63 mmHg  Pulse 63  Temp(Src) 98.1 F (36.7 C) (Oral)  Resp 16  SpO2 100%  LMP 04/13/2014 Physical Exam Physical Exam  Constitutional: oriented to person, place, and time. appears well-developed and well-nourished. No distress.  HENT:  Head: Normocephalic and atraumatic.  Eyes: EOMI. PERRL.  Tonsils 1+ with exudate and pharyngeal cobblestoning. Nasal discharge. TMs normal bilaterally Neck: Normal range of motion.  Cardiovascular: RRR, no m/r/g, 2+ distal pulses,  Pulmonary/Chest: Effort normal and breath sounds normal. No respiratory distress.  Abdominal: Soft. Bowel sounds are normal. NonTTP, no distension.  Musculoskeletal: Normal range of motion. Non ttp, no effusion.  Neurological: alert and oriented to person, place, and time.  Skin: Skin is warm. No rash noted. non diaphoretic.  Psychiatric: normal mood and affect. behavior is normal. Judgment and thought content normal.   ED Course  Procedures (including critical care time) Labs Review Labs Reviewed  POCT RAPID STREP A ([facility] CARE ONLY)  POCT INFECTIOUS MONO SCREEN    Imaging Review No results found.   MDM   1. Tonsillitis   2. Laryngitis   3. URI (upper respiratory infection)    Decadron 10 mg given in office. Start nasal Atrovent, Flonase, Zyrtec, ibuprofen for symptomatically relief. The patient not improving in 24-48 hours or if she gets worse recommend patient start the Augmentin as she is likely developing the beginnings of sinus infection as well as a severe tonsillitis.  Ozella Rocks, MD 04/27/14 [phone]

## 2014-04-29 LAB — CULTURE, GROUP A STREP: Strep A Culture: NEGATIVE

## 2014-04-30 ENCOUNTER — Encounter (HOSPITAL_COMMUNITY): Payer: Self-pay | Admitting: *Deleted

## 2014-04-30 ENCOUNTER — Emergency Department (INDEPENDENT_AMBULATORY_CARE_PROVIDER_SITE_OTHER)
Admission: EM | Admit: 2014-04-30 | Discharge: 2014-04-30 | Disposition: A | Discharge status: Home / Self Care | Payer: Self-pay | Source: Home / Self Care | Attending: Family Medicine | Admitting: Family Medicine

## 2014-04-30 DIAGNOSIS — J029 Acute pharyngitis, unspecified: Secondary | ICD-10-CM

## 2014-04-30 LAB — POCT RAPID STREP A: STREPTOCOCCUS, GROUP A SCREEN (DIRECT): NEGATIVE

## 2014-04-30 MED ORDER — GI COCKTAIL ~~LOC~~
30.0000 mL | Freq: Once | ORAL | Status: AC
  Administered 2014-04-30: 30 mL via ORAL

## 2014-04-30 MED ORDER — GI COCKTAIL ~~LOC~~
ORAL | Status: AC
  Filled 2014-04-30: qty 30

## 2014-04-30 NOTE — Discharge Instructions (Signed)
Use medicines previously given, see your doctor if further problems.

## 2014-04-30 NOTE — ED Notes (Signed)
Pt  Sitting  Upright  On  The  Exam  Table speaking in  Complete  sentances  Reporting  Symptoms of  sorethroat  -  Pain  When  She  Swallows  As  Well  As  Malaise  And   Body  Aches    She  [facility]   [facility]  [facility]  [facility]  A[facility]  But  Never  Got her  RX  Filled

## 2014-04-30 NOTE — ED Provider Notes (Signed)
CSN: [id number]     Arrival date & time 04/30/14  1201 History   First MD Initiated Contact with Patient 04/30/14 1212     Chief Complaint  Patient presents with  . Sore Throat   (Consider location/radiation/quality/duration/timing/severity/associated sxs/prior Treatment) Patient is a [age] y.o. female presenting with pharyngitis. The history is provided by the patient.  Sore Throat This is a new problem. The current episode started more than 2 days ago. The problem has not changed ([facility], given mult meds, rx not filled, sx continue.) since onset.The symptoms are aggravated by swallowing.    Past Medical History  Diagnosis Date  . Seasonal allergies    History reviewed. No pertinent past surgical history. Family History  Problem Relation Age of Onset  . Alcohol abuse Father    History  Substance Use Topics  . Smoking status: Never Smoker   . Smokeless tobacco: Not on file  . Alcohol Use: No   OB History    No data available     Review of Systems  Constitutional: Negative.  Negative for fever.  HENT: Positive for congestion, postnasal drip and rhinorrhea.   Respiratory: Negative.   Cardiovascular: Negative.     Allergies  Mold extract  Home Medications   Prior to Admission medications   Medication Sig Start Date End Date Taking? Authorizing Provider  acyclovir (ZOVIRAX) 400 MG tablet TAKE 1 TABLET BY MOUTH 2 TIMES DAILY. 02/14/14   Massie MaroonLachina M Hollis, FNP  amoxicillin-clavulanate (AUGMENTIN) 875-125 MG per tablet Take 1 tablet by mouth 2 (two) times daily. 04/27/14   Ozella Rocksavid J Merrell, MD  fluconazole (DIFLUCAN) 150 MG tablet Take 1 tablet (150 mg total) by mouth daily. Repeat dose in 3 days 04/27/14   Ozella Rocksavid J Merrell, MD  fluticasone [facility](FLONASE) 50 MCG/ACT nasal spray Place 2 sprays into both nostrils at bedtime. 04/27/14   Ozella Rocksavid J Merrell, MD  ipratropium (ATROVENT) 0.06 % nasal spray Place 2 sprays into both nostrils 4 (four) times daily. 04/27/14   Ozella Rocksavid  J Merrell, MD   BP 99/66 mmHg  Pulse 70  Temp(Src) 98.3 F (36.8 C) (Oral)  Resp 16  SpO2 98%  LMP 04/13/2014 Physical Exam  Constitutional: She is oriented to person, place, and time. She appears well-developed and well-nourished. No distress.  HENT:  Head: Normocephalic.  Right Ear: External ear normal.  Left Ear: External ear normal.  Mouth/Throat: Oropharynx is clear and moist. No oropharyngeal exudate.  Neck: Normal range of motion. Neck supple.  Pulmonary/Chest: Effort normal and breath sounds normal.  Lymphadenopathy:    She has no cervical adenopathy.  Neurological: She is alert and oriented to person, place, and time.  Skin: Skin is warm and dry.  Nursing note and vitals reviewed.   ED Course  Procedures (including critical care time) Labs Review Labs Reviewed  POCT RAPID STREP A ([facility] ONLY)    Imaging Review No results found.   MDM   1. Acute pharyngitis, unspecified pharyngitis type    Strep cult from 4/14 neg, repeat rapid strep today is neg.    Linna HoffJames D Rahshawn Remo, MD 04/30/14 1239

## 2014-05-02 LAB — CULTURE, GROUP A STREP: STREP A CULTURE: NEGATIVE

## 2014-05-29 ENCOUNTER — Ambulatory Visit: Payer: Self-pay | Attending: Internal Medicine

## 2014-06-13 ENCOUNTER — Encounter: Payer: Self-pay | Admitting: Family Medicine

## 2014-06-13 ENCOUNTER — Ambulatory Visit (INDEPENDENT_AMBULATORY_CARE_PROVIDER_SITE_OTHER): Payer: Self-pay | Admitting: Family Medicine

## 2014-06-13 DIAGNOSIS — Z0000 Encounter for general adult medical examination without abnormal findings: Secondary | ICD-10-CM

## 2014-06-13 LAB — CBC WITH DIFFERENTIAL/PLATELET
BASOS ABS: 0.1 10*3/uL (ref 0.0–0.1)
Basophils Relative: 1 % (ref 0–1)
EOS ABS: 0.2 10*3/uL (ref 0.0–0.7)
EOS PCT: 3 % (ref 0–5)
HCT: 43.6 % (ref 36.0–46.0)
HEMOGLOBIN: 14.6 g/dL (ref 12.0–15.0)
Lymphocytes Relative: 29 % (ref 12–46)
Lymphs Abs: 2 10*3/uL (ref 0.7–4.0)
MCH: 29.5 pg (ref 26.0–34.0)
MCHC: 33.5 g/dL (ref 30.0–36.0)
MCV: 88.1 fL (ref 78.0–100.0)
MONO ABS: 0.4 10*3/uL (ref 0.1–1.0)
MONOS PCT: 6 % (ref 3–12)
MPV: 10.5 fL (ref 8.6–12.4)
NEUTROS PCT: 61 % (ref 43–77)
Neutro Abs: 4.1 10*3/uL (ref 1.7–7.7)
PLATELETS: 326 10*3/uL (ref 150–400)
RBC: 4.95 MIL/uL (ref 3.87–5.11)
RDW: 13.3 % (ref 11.5–15.5)
WBC: 6.8 10*3/uL (ref 4.0–10.5)

## 2014-06-13 LAB — COMPREHENSIVE METABOLIC PANEL
ALT: 18 U/L (ref 0–35)
AST: 19 U/L (ref 0–37)
Albumin: 4.6 g/dL (ref 3.5–5.2)
Alkaline Phosphatase: 55 U/L (ref 39–117)
BUN: 10 mg/dL (ref 6–23)
CO2: 27 meq/L (ref 19–32)
CREATININE: 0.76 mg/dL (ref 0.50–1.10)
Calcium: 9.7 mg/dL (ref 8.4–10.5)
Chloride: 103 mEq/L (ref 96–112)
Glucose, Bld: 81 mg/dL (ref 70–99)
Potassium: 4.3 mEq/L (ref 3.5–5.3)
SODIUM: 138 meq/L (ref 135–145)
Total Bilirubin: 1 mg/dL (ref 0.2–1.2)
Total Protein: 7.3 g/dL (ref 6.0–8.3)

## 2014-06-13 LAB — POCT URINALYSIS DIP (DEVICE)
BILIRUBIN URINE: NEGATIVE
Glucose, UA: NEGATIVE mg/dL
HGB URINE DIPSTICK: NEGATIVE
KETONES UR: NEGATIVE mg/dL
Leukocytes, UA: NEGATIVE
NITRITE: NEGATIVE
Protein, ur: NEGATIVE mg/dL
Specific Gravity, Urine: 1.02 (ref 1.005–1.030)
Urobilinogen, UA: 0.2 mg/dL (ref 0.0–1.0)
pH: 7 (ref 5.0–8.0)

## 2014-06-13 LAB — LIPID PANEL
CHOL/HDL RATIO: 4.3 ratio
CHOLESTEROL: 184 mg/dL (ref 0–200)
HDL: 43 mg/dL — ABNORMAL LOW (ref >=46)
LDL Cholesterol: 123 mg/dL — ABNORMAL HIGH (ref 0–99)
Triglycerides: 91 mg/dL (ref <150)
VLDL: 18 mg/dL (ref 0–40)

## 2014-06-13 NOTE — Progress Notes (Signed)
Subjective:    Patient ID: Kaitlyn Franco, female    DOB: 06/16/1975, 39 y.o.   MRN: [id number]  HPI Kaitlyn Franco, a 39 year old female, presents for a complete physical examination. Patient states that she feels well and is without complaint. She exercises 3-4 days per week and follows a vegetarian diet. She states that overall health has improved since removing animal fat from diet. [name] reports that she has not had a dental or eye exam over the past several years due to insurance constraints. She states that she does not perform monthly self-breast examinations. She is up to date with pap smear. [name] is not up to date with vaccinations and is currently refusing vaccinations.  Past Medical History  Diagnosis Date  . Seasonal allergies    History   Social History  . Marital Status: Single    Spouse Name: N/A  . Number of Children: N/A  . Years of Education: N/A   Occupational History  . Not on file.   Social History Main Topics  . Smoking status: Never Smoker   . Smokeless tobacco: Not on file  . Alcohol Use: No  . Drug Use: No  . Sexual Activity: Not on file   Other Topics Concern  . Not on file   Social History Narrative   Allergies  Allergen Reactions  . Mold Extract [Trichophyton]    There is no immunization history on file for this patient. Review of Systems  Constitutional: Negative.  Negative for fever, fatigue and unexpected weight change.  HENT: Negative.   Eyes: Negative.  Negative for visual disturbance.  Respiratory: Negative.  Negative for shortness of breath.   Cardiovascular: Negative.  Negative for palpitations and leg swelling.  Gastrointestinal: Negative.   Endocrine: Negative.  Negative for polydipsia, polyphagia and polyuria.  Genitourinary: Negative.  Negative for dysuria.  Musculoskeletal: Negative.   Skin: Negative.   Allergic/Immunologic: Negative.  Negative for immunocompromised state.  Neurological: Negative.   Hematological:  Negative.   Psychiatric/Behavioral: Negative.  Negative for suicidal ideas and sleep disturbance.       Objective:   Physical Exam  Constitutional: She is oriented to person, place, and time. Vital signs are normal. She appears well-developed and well-nourished.  HENT:  Head: Normocephalic and atraumatic.  Right Ear: Hearing, tympanic membrane, external ear and ear canal normal.  Left Ear: Hearing, tympanic membrane, external ear and ear canal normal.  Nose: Nose normal.  Mouth/Throat: Uvula is midline and oropharynx is clear and moist.  Eyes: Conjunctivae, EOM and lids are normal. Pupils are equal, round, and reactive to light.  Neck: Normal range of motion. Neck supple.  Cardiovascular: Normal rate, regular rhythm, S1 normal, S2 normal, normal heart sounds, intact distal pulses and normal pulses.   Pulmonary/Chest: Effort normal and breath sounds normal.  Abdominal: Soft. Normal appearance and bowel sounds are normal. There is no tenderness.  Musculoskeletal: Normal range of motion.  Lymphadenopathy:       Head (right side): No submental and no submandibular adenopathy present.       Head (left side): No submental and no submandibular adenopathy present.  Neurological: She is alert and oriented to person, place, and time. She has normal strength and normal reflexes. She displays a negative Romberg sign.  Reflex Scores:      Tricep reflexes are 2+ on the right side and 2+ on the left side.      Bicep reflexes are 2+ on the right side and 2+  on the left side.      Brachioradialis reflexes are 2+ on the right side and 2+ on the left side.      Patellar reflexes are 2+ on the right side and 2+ on the left side.      Achilles reflexes are 2+ on the right side and 2+ on the left side. Skin: Skin is warm, dry and intact.  Psychiatric: She has a normal mood and affect. Her behavior is normal. Judgment and thought content normal.     BP 100/76 mmHg  Pulse 67  Temp(Src) 97.4 F (36.3 C)  (Oral)  Resp 16  Ht  (1.575 m)  Wt 109 lb (49.442 kg)  BMI 19.93 kg/m2  LMP 06/05/2014 Assessment & Plan:  1. Routine general medical examination at a health care facility - CBC with Differential - Comprehensive metabolic panel - Lipid panel - Hemoglobin A1c - POCT urinalysis dip (device)  Patient refuses vaccinations.  Patient wears sunscreen and protective clothing Dental visit: several years ago Eye exam: 3 years ago Monthly self breast: does not perform. Given written information and discussed at length. Will need a mammogram in 1 year.  LMP: 06/06/2014; Last pap smear 08/28/2014, which was normal  Sexually active: condoms Balanced diet: divided over 6 small meals Water: 6-8  Exercising: 3-4 days per week   Massie Maroon, FNP

## 2014-06-14 ENCOUNTER — Telehealth: Payer: Self-pay | Admitting: Family Medicine

## 2014-06-14 LAB — HEMOGLOBIN A1C
HEMOGLOBIN A1C: 5.5 % (ref <5.7)
MEAN PLASMA GLUCOSE: 111 mg/dL (ref <117)

## 2014-06-14 NOTE — Patient Instructions (Addendum)
Breast Self-Awareness Breast self-awareness allows you to notice a breast problem early while it is still small. Do a breast self-exam:  Every month, 5-7 days after your period (menstrual period).  At the same time each month if you do not have periods anymore. Look for any:  Difference between your breasts (size, shape, or position).  Change in breast shape or size.  Fluid or blood coming from your nipples.  Changes in your nipples (dimpling, nipple movement).   Change in skin color or texture (redness, scaly areas). Feel for:  Lumps.  Bumps.  Dips.  Any other changes. HOW TO DO A BREAST SELF-EXAM Look at your breasts and nipples.  Take off all your clothes above your waist.  Stand in front of a mirror in a room with good lighting.  Put your hands on your hips and push your hands downward. Feel your breasts.   Lie flat on your back or stand in the shower or tub. If you are in the shower or tub, have wet, soapy hands.  Place your right arm above your head.  Place your left hand in the right underarm area.  Make small circles using the pads (not the fingertips) of your 3 middle fingers. Press lightly and then with medium and firm pressure.  Move your fingers a little lower and make the small circles at the 3 pressures (light, medium, and firm).  Continue moving your fingers lower and making circles until you reach the bottom of your breast.  Move your fingers one finger-width towards the center of the body.  Continue making the circles, this time moving upward until you reach the bottom of your neck.  Move your fingers one finger-width towards the center of your body.  Make circles downward when starting at the bottom of the neck. Make circles upward when starting at the bottom of the breast. Stop when you reach the middle of the chest.   Repeat these steps on the other breast. Write down what looks and feels normal for each breast. Also write down any changes  you notice. GET HELP RIGHT AWAY IF:  You see any changes in your breasts or nipples.  You see skin changes.  You have unusual discharge from your nipples.  You feel a new lump.  You feel unusually thick areas. Document Released: 06/18/2007 Document Revised: 12/17/2011 Document Reviewed: 04/16/2011 [facility] Patient Information 2015 [place], [place]. This information is not intended to replace advice given to you by your health care provider. Make sure you discuss any questions you have with your health care provider. Health Maintenance Adopting a healthy lifestyle and getting preventive care can go a long way to promote health and wellness. Talk with your health care provider about what schedule of regular examinations is right for you. This is a good chance for you to check in with your provider about disease prevention and staying healthy. In between checkups, there are plenty of things you can do on your own. Experts have done a lot of research about which lifestyle changes and preventive measures are most likely to keep you healthy. Ask your health care provider for more information. WEIGHT AND DIET  Eat a healthy diet  Be sure to include plenty of vegetables, fruits, low-fat dairy products, and lean protein.  Do not eat a lot of foods high in solid fats, added sugars, or salt.  Get regular exercise. This is one of the most important things you can do for your health.  Most adults should  exercise for at least 150 minutes each week. The exercise should increase your heart rate and make you sweat (moderate-intensity exercise).  Most adults should also do strengthening exercises at least twice a week. This is in addition to the moderate-intensity exercise.  Maintain a healthy weight  Body mass index (BMI) is a measurement that can be used to identify possible weight problems. It estimates body fat based on height and weight. Your health care provider can help determine your BMI and  help you achieve or maintain a healthy weight.  For females 17 years of age and older:   A BMI below 18.5 is considered underweight.  A BMI of 18.5 to 24.9 is normal.  A BMI of 25 to 29.9 is considered overweight.  A BMI of 30 and above is considered obese.  Watch levels of cholesterol and blood lipids  You should start having your blood tested for lipids and cholesterol at 39 years of age, then have this test every 5 years.  You may need to have your cholesterol levels checked more often if:  Your lipid or cholesterol levels are high.  You are older than 39 years of age.  You are at high risk for heart disease.  CANCER SCREENING   Lung Cancer  Lung cancer screening is recommended for adults 24-5 years old who are at high risk for lung cancer because of a history of smoking.  A yearly low-dose CT scan of the lungs is recommended for people who:  Currently smoke.  Have quit within the past 15 years.  Have at least a 30-pack-year history of smoking. A pack year is smoking an average of one pack of cigarettes a day for 1 year.  Yearly screening should continue until it has been 15 years since you quit.  Yearly screening should stop if you develop a health problem that would prevent you from having lung cancer treatment.  Breast Cancer  Practice breast self-awareness. This means understanding how your breasts normally appear and feel.  It also means doing regular breast self-exams. Let your health care provider know about any changes, no matter how small.  If you are in your 20s or 30s, you should have a clinical breast exam (CBE) by a health care provider every 1-3 years as part of a regular health exam.  If you are 33 or older, have a CBE every year. Also consider having a breast X-ray (mammogram) every year.  If you have a family history of breast cancer, talk to your health care provider about genetic screening.  If you are at high risk for breast cancer, talk  to your health care provider about having an MRI and a mammogram every year.  Breast cancer gene (BRCA) assessment is recommended for women who have family members with BRCA-related cancers. BRCA-related cancers include:  Breast.  Ovarian.  Tubal.  Peritoneal cancers.  Results of the assessment will determine the need for genetic counseling and BRCA1 and BRCA2 testing. Cervical Cancer Routine pelvic examinations to screen for cervical cancer are no longer recommended for nonpregnant women who are considered low risk for cancer of the pelvic organs (ovaries, uterus, and vagina) and who do not have symptoms. A pelvic examination may be necessary if you have symptoms including those associated with pelvic infections. Ask your health care provider if a screening pelvic exam is right for you.   The Pap test is the screening test for cervical cancer for women who are considered at risk.  If you had  a hysterectomy for a problem that was not cancer or a condition that could lead to cancer, then you no longer need Pap tests.  If you are older than 65 years, and you have had normal Pap tests for the past 10 years, you no longer need to have Pap tests.  If you have had past treatment for cervical cancer or a condition that could lead to cancer, you need Pap tests and screening for cancer for at least 20 years after your treatment.  If you no longer get a Pap test, assess your risk factors if they change (such as having a new sexual partner). This can affect whether you should start being screened again.  Some women have medical problems that increase their chance of getting cervical cancer. If this is the case for you, your health care provider may recommend more frequent screening and Pap tests.  The human papillomavirus (HPV) test is another test that may be used for cervical cancer screening. The HPV test looks for the virus that can cause cell changes in the cervix. The cells collected during  the Pap test can be tested for HPV.  The HPV test can be used to screen women 60 years of age and older. Getting tested for HPV can extend the interval between normal Pap tests from three to five years.  An HPV test also should be used to screen women of any age who have unclear Pap test results.  After 39 years of age, women should have HPV testing as often as Pap tests.  Colorectal Cancer  This type of cancer can be detected and often prevented.  Routine colorectal cancer screening usually begins at 39 years of age and continues through 39 years of age.  Your health care provider may recommend screening at an earlier age if you have risk factors for colon cancer.  Your health care provider may also recommend using home test kits to check for hidden blood in the stool.  A small camera at the end of a tube can be used to examine your colon directly (sigmoidoscopy or colonoscopy). This is done to check for the earliest forms of colorectal cancer.  Routine screening usually begins at age 18.  Direct examination of the colon should be repeated every 5-10 years through 39 years of age. However, you may need to be screened more often if early forms of precancerous polyps or small growths are found. Skin Cancer  Check your skin from head to toe regularly.  Tell your health care provider about any new moles or changes in moles, especially if there is a change in a mole's shape or color.  Also tell your health care provider if you have a mole that is larger than the size of a pencil eraser.  Always use sunscreen. Apply sunscreen liberally and repeatedly throughout the day.  Protect yourself by wearing long sleeves, pants, a wide-brimmed hat, and sunglasses whenever you are outside. HEART DISEASE, DIABETES, AND HIGH BLOOD PRESSURE   Have your blood pressure checked at least every 1-2 years. High blood pressure causes heart disease and increases the risk of stroke.  If you are between 42  years and 63 years old, ask your health care provider if you should take aspirin to prevent strokes.  Have regular diabetes screenings. This involves taking a blood sample to check your fasting blood sugar level.  If you are at a normal weight and have a low risk for diabetes, have this test once every three  years after 39 years of age.  If you are overweight and have a high risk for diabetes, consider being tested at a younger age or more often. PREVENTING INFECTION  Hepatitis B  If you have a higher risk for hepatitis B, you should be screened for this virus. You are considered at high risk for hepatitis B if:  You were born in a country where hepatitis B is common. Ask your health care provider which countries are considered high risk.  Your parents were born in a high-risk country, and you have not been immunized against hepatitis B (hepatitis B vaccine).  You have HIV or AIDS.  You use needles to inject street drugs.  You live with someone who has hepatitis B.  You have had sex with someone who has hepatitis B.  You get hemodialysis treatment.  You take certain medicines for conditions, including cancer, organ transplantation, and autoimmune conditions. Hepatitis C  Blood testing is recommended for:  Everyone born from [birth year] through 1965.  Anyone with known risk factors for hepatitis C. Sexually transmitted infections (STIs)  You should be screened for sexually transmitted infections (STIs) including gonorrhea and chlamydia if:  You are sexually active and are younger than 39 years of age.  You are older than 39 years of age and your health care provider tells you that you are at risk for this type of infection.  Your sexual activity has changed since you were last screened and you are at an increased risk for chlamydia or gonorrhea. Ask your health care provider if you are at risk.  If you do not have HIV, but are at risk, it may be recommended that you take a  prescription medicine daily to prevent HIV infection. This is called pre-exposure prophylaxis (PrEP). You are considered at risk if:  You are sexually active and do not regularly use condoms or know the HIV status of your partner(s).  You take drugs by injection.  You are sexually active with a partner who has HIV. Talk with your health care provider about whether you are at high risk of being infected with HIV. If you choose to begin PrEP, you should first be tested for HIV. You should then be tested every 3 months for as long as you are taking PrEP.  PREGNANCY   If you are premenopausal and you may become pregnant, ask your health care provider about preconception counseling.  If you may become pregnant, take 400 to 800 micrograms (mcg) of folic acid every day.  If you want to prevent pregnancy, talk to your health care provider about birth control (contraception). OSTEOPOROSIS AND MENOPAUSE   Osteoporosis is a disease in which the bones lose minerals and strength with aging. This can result in serious bone fractures. Your risk for osteoporosis can be identified using a bone density scan.  If you are 60 years of age or older, or if you are at risk for osteoporosis and fractures, ask your health care provider if you should be screened.  Ask your health care provider whether you should take a calcium or vitamin D supplement to lower your risk for osteoporosis.  Menopause may have certain physical symptoms and risks.  Hormone replacement therapy may reduce some of these symptoms and risks. Talk to your health care provider about whether hormone replacement therapy is right for you.  HOME CARE INSTRUCTIONS   Schedule regular health, dental, and eye exams.  Stay current with your immunizations.   Do not use any tobacco  products including cigarettes, chewing tobacco, or electronic cigarettes.  If you are pregnant, do not drink alcohol.  If you are breastfeeding, limit how much and  how often you drink alcohol.  Limit alcohol intake to no more than 1 drink per day for nonpregnant women. One drink equals 12 ounces of beer, 5 ounces of wine, or 1 ounces of hard liquor.  Do not use street drugs.  Do not share needles.  Ask your health care provider for help if you need support or information about quitting drugs.  Tell your health care provider if you often feel depressed.  Tell your health care provider if you have ever been abused or do not feel safe at home. Document Released: 07/15/2010 Document Revised: 05/16/2013 Document Reviewed: 12/01/2012 [facility] Patient Information 2015 [place], [place]. This information is not intended to replace advice given to you by your health care provider. Make sure you discuss any questions you have with your health care provider.

## 2014-06-14 NOTE — Telephone Encounter (Signed)
Patient informed that LDL cholesterol is mildly elevated at 123, the goal is <100. Recommend that Kaitlyn Franco continue a lowfat diet divided over 6 small meals and exercise regimen. Will re-check cholesterol level during annual physical examination.   Massie MaroonHollis,Chere Babson M, FNP

## 2014-06-14 NOTE — Telephone Encounter (Signed)
Called and left message regarding elevated ldl and to continue low fat small meals 6 times a day.

## 2014-06-20 ENCOUNTER — Ambulatory Visit: Payer: Self-pay | Admitting: Psychiatry

## 2014-07-20 ENCOUNTER — Ambulatory Visit (INDEPENDENT_AMBULATORY_CARE_PROVIDER_SITE_OTHER): Payer: Self-pay | Admitting: Psychiatry

## 2014-07-20 DIAGNOSIS — F919 Conduct disorder, unspecified: Secondary | ICD-10-CM

## 2014-07-20 DIAGNOSIS — F332 Major depressive disorder, recurrent severe without psychotic features: Secondary | ICD-10-CM

## 2014-07-20 DIAGNOSIS — F4310 Post-traumatic stress disorder, unspecified: Secondary | ICD-10-CM

## 2014-07-20 DIAGNOSIS — F909 Attention-deficit hyperactivity disorder, unspecified type: Secondary | ICD-10-CM

## 2014-07-24 ENCOUNTER — Ambulatory Visit: Payer: Self-pay | Admitting: Family Medicine

## 2014-09-21 ENCOUNTER — Other Ambulatory Visit: Payer: Self-pay | Admitting: Family Medicine

## 2015-06-14 ENCOUNTER — Encounter: Payer: Self-pay | Admitting: Internal Medicine

## 2023-04-07 ENCOUNTER — Ambulatory Visit: Payer: Self-pay

## 2023-04-07 ENCOUNTER — Ambulatory Visit
Admission: EM | Admit: 2023-04-07 | Discharge: 2023-04-07 | Disposition: A | Discharge status: Home / Self Care | Attending: Family Medicine | Admitting: Family Medicine

## 2023-04-07 ENCOUNTER — Ambulatory Visit: Payer: Self-pay | Admitting: General Practice

## 2023-04-07 DIAGNOSIS — L538 Other specified erythematous conditions: Secondary | ICD-10-CM | POA: Diagnosis not present

## 2023-04-07 DIAGNOSIS — R21 Rash and other nonspecific skin eruption: Secondary | ICD-10-CM | POA: Diagnosis not present

## 2023-04-07 MED ORDER — PREDNISONE 20 MG PO TABS: ORAL_TABLET | ORAL | 0 refills | Status: AC

## 2023-04-07 NOTE — ED Triage Notes (Signed)
"  I have a rash on my face and this has been recurrent since Jan. 2025, very itchy". No PCP.

## 2023-04-07 NOTE — Telephone Encounter (Signed)
 This encounter was created in error - please disregard.

## 2023-04-07 NOTE — Telephone Encounter (Signed)
  Chief Complaint: Rash Symptoms: red, itchy rash, and flaky Frequency: intermittent Pertinent Negatives: Patient denies fever Disposition: [] ED /[x] Urgent Care (no appt availability in office) / [] Appointment(In office/virtual)/ []  [facility]/ [] Home Care/ [] Refused Recommended Disposition /[] [facility]/ []  Follow-up with PCP Additional Notes: Pt reports rash since January, has no PCP at this time, last visit was at [facility] in 2016. Pt would like to return there to establish care. No acute visits avail, RN advised UC. Pt is agreeable.Pt scheduled her AWV at [facility] to re-established care.  Reason for Disposition  [1] Looks infected (spreading redness, pus) AND [2] no fever  Answer Assessment - Initial Assessment Questions 1. APPEARANCE of RASH: "Describe the rash."      Red, flaky,  2. LOCATION: "Where is the rash located?"      Right side of face in a "streak" and on left side near chin  3. NUMBER: "How many spots are there?"      2   4. SIZE: "How big are the spots?" (Inches, centimeters or compare to size of a coin)      Several inches long  5. ONSET: "When did the rash start?"      Started January  6. ITCHING: "Does the rash itch?" If Yes, ask: "How bad is the itch?"  (Scale 0-10; or none, mild, moderate, severe)     Itches intensely (moderate to severe)  7. PAIN: "Does the rash hurt?" If Yes, ask: "How bad is the pain?"  (Scale 0-10; or none, mild, moderate, severe)    - NONE (0): no pain    - MILD (1-3): doesn't interfere with normal activities     - MODERATE (4-7): interferes with normal activities or awakens from sleep     - SEVERE (8-10): excruciating pain, unable to do any normal activities     No  8. OTHER SYMPTOMS: "Do you have any other symptoms?" (e.g., fever)     No  9. PREGNANCY: "Is there any chance you are pregnant?" "When was your last menstrual period?"     No; LMP was this month  Protocols used: Rash or Redness - Localized-A-AH

## 2023-04-07 NOTE — Telephone Encounter (Signed)
 Copied from CRM [id number]. Topic: Clinical - Red Word Triage >> Apr 07, 2023  8:12 AM Izetta Dakin wrote: [facility] that prompted transfer to Nurse Triage: Rash with itching-face

## 2023-04-07 NOTE — Discharge Instructions (Signed)
 You have been referred to dermatology.  Their office will reach out to you via phone to schedule an appointment.  I am treating you with prednisone to treat the itching and the distribution of the rash spreading any further longer on your face.  Benadryl as needed for itching however after being on prednisone for 48 hours the itching should resolve.  If you have not heard from the dermatology office within 48 hours call their office directly to schedule an appointment

## 2023-04-07 NOTE — ED Provider Notes (Signed)
 [name]    CSN: [id number] Arrival date & time: 04/07/23  4098      History   Chief Complaint Chief Complaint  Patient presents with   Skin Problem    HPI Kaitlyn Franco is a 48 y.o. female.   HPI Patient presents with a 40-month history of a rash involving her face, specifically bilateral cheeks, chin and above her lip, forehead nasal bridge region.  She reports the rash has waxed and waned however has never completely resolved since the onset in January.  She does not follow-up primary care doctor therefore has not had any of our evaluation of her thyroid symptoms.  She has been using multiple over-the-counter medications without relief.  Reports 2 isolated rash-like areas on chronic arm however the rashes on her arms and neck.  To be the exact same as the rash that is covering her face.  She has not had any prescribed treatment as this is the first time this rash is being evaluated.  Past Medical History:  Diagnosis Date   Seasonal allergies     Patient Active Problem List   Diagnosis Date Noted   Left knee pain 09/26/2013   Midline low back pain without sciatica 09/26/2013   Herpes simplex 08/16/2013   Situational depression 08/16/2013    History reviewed. No pertinent surgical history.  OB History   No obstetric history on file.      Home Medications    Prior to Admission medications   Medication Sig Start Date End Date Taking? Authorizing Provider  predniSONE (DELTASONE) 20 MG tablet Take 2 tablets (40 mg total) by mouth daily with breakfast for 1 day, THEN 1 tablet (20 mg total) daily with breakfast for 6 days, THEN 0.5 tablets (10 mg total) daily with breakfast for 3 days. 04/07/23 04/17/23 Yes Bing Neighbors, NP  acyclovir (ZOVIRAX) 400 MG tablet TAKE 1 TABLET BY MOUTH 2 TIMES DAILY. 09/21/14   Massie Maroon, FNP  ipratropium (ATROVENT) 0.06 % nasal spray Place 2 sprays into both nostrils 4 (four) times daily. Patient not taking: Reported on  06/13/2014 04/27/14   Ozella Rocks, MD    Family History Family History  Problem Relation Age of Onset   Alcohol abuse Father     Social History Social History   Tobacco Use   Smoking status: Never   Smokeless tobacco: Never  Vaping Use   Vaping status: Never Used  Substance Use Topics   Alcohol use: Not Currently   Drug use: Never     Allergies   Mold extract [trichophyton]   Review of Systems Review of Systems Pertinent negatives listed in HPI   Physical Exam Triage Vital Signs ED Triage Vitals  Encounter Vitals Group     BP 04/07/23 0952 110/66     Systolic BP Percentile --      Diastolic BP Percentile --      Pulse Rate 04/07/23 0952 69     Resp 04/07/23 0952 18     Temp 04/07/23 0952 98.1 F (36.7 C)     Temp Source 04/07/23 0952 Oral     SpO2 04/07/23 0952 99 %     Weight 04/07/23 0950 117 lb (53.1 kg)     Height 04/07/23 0950 5\' 2"  (1.575 m)     Head Circumference --      Peak Flow --      Pain Score 04/07/23 0947 0     Pain Loc --  Pain Education --      Exclude from Growth Chart --    No data found.  Updated Vital Signs BP 110/66 (BP Location: Left Arm)   Pulse 69   Temp 98.1 F (36.7 C) (Oral)   Resp 18   Ht 5\' 2"  (1.575 m)   Wt 117 lb (53.1 kg)   LMP 03/25/2023 (Exact Date)   SpO2 99%   BMI 21.40 kg/m   Visual Acuity Right Eye Distance:   Left Eye Distance:   Bilateral Distance:    Right Eye Near:   Left Eye Near:    Bilateral Near:     Physical Exam Vitals reviewed.  Constitutional:      Appearance: Normal appearance.  HENT:     Head: Normocephalic and atraumatic.  Eyes:     Extraocular Movements: Extraocular movements intact.     Pupils: Pupils are equal, round, and reactive to light.  Cardiovascular:     Rate and Rhythm: Normal rate and regular rhythm.  Pulmonary:     Effort: Pulmonary effort is normal.     Breath sounds: Normal breath sounds.  Musculoskeletal:        General: Normal range of motion.      Cervical back: Normal range of motion and neck supple.  Skin:    Findings: Erythema (facial rash) and rash (facial rash) present. Rash is macular (Facial rash).  Neurological:     General: No focal deficit present.     Mental Status: She is alert.      UC Treatments / Results  Labs (all labs ordered are listed, but only abnormal results are displayed) Labs Reviewed - No data to display  EKG   Radiology No results found.  Procedures Procedures (including critical care time)  Medications Ordered in UC Medications - No data to display  Initial Impression / Assessment and Plan / UC Course  I have reviewed the triage vital signs and the nursing notes.  Pertinent labs & imaging results that were available during my care of the patient were reviewed by me and considered in my medical decision making (see chart for details).    Patient here today with a 61-month history of a facial macular erythematous itchy rash.  On exam there is concern for possible rosacea, patient has been no history of any inflammatory related rash.  Treating with a burst of prednisone.  Patient has been referred to dermatology encouraged her to follow-up for more definitive diagnoses given that this rash is mainly involving her face advised of the importance of follow-up.  Patient verbalized understanding and agreement with plan. Final Clinical Impressions(s) / UC Diagnoses   Final diagnoses:  Macular erythematous rash  Facial rash     Discharge Instructions      You have been referred to dermatology.  Their office will reach out to you via phone to schedule an appointment.  I am treating you with prednisone to treat the itching and the distribution of the rash spreading any further longer on your face.  Benadryl as needed for itching however after being on prednisone for 48 hours the itching should resolve.  If you have not heard from the dermatology office within 48 hours call their office directly to  schedule an appointment        ED Prescriptions     Medication Sig Dispense Auth. Provider   predniSONE (DELTASONE) 20 MG tablet Take 2 tablets (40 mg total) by mouth daily with breakfast for 1 day, THEN 1  tablet (20 mg total) daily with breakfast for 6 days, THEN 0.5 tablets (10 mg total) daily with breakfast for 3 days. 9.5 tablet Bing Neighbors, NP      PDMP not reviewed this encounter.   Bing Neighbors, NP 04/10/23 1520

## 2023-05-25 ENCOUNTER — Telehealth: Payer: Self-pay | Admitting: Dermatology

## 2023-05-25 ENCOUNTER — Ambulatory Visit: Admitting: Dermatology

## 2023-05-25 ENCOUNTER — Encounter: Payer: Self-pay | Admitting: Dermatology

## 2023-05-25 VITALS — BP 100/69

## 2023-05-25 DIAGNOSIS — B358 Other dermatophytoses: Secondary | ICD-10-CM

## 2023-05-25 DIAGNOSIS — F419 Anxiety disorder, unspecified: Secondary | ICD-10-CM | POA: Diagnosis not present

## 2023-05-25 MED ORDER — TERBINAFINE HCL 1 % EX CREA: 1.0000 | TOPICAL_CREAM | Freq: Two times a day (BID) | CUTANEOUS | 2 refills | Status: AC

## 2023-05-25 MED ORDER — TERBINAFINE HCL 250 MG PO TABS: 250.0000 mg | ORAL_TABLET | Freq: Every day | ORAL | 1 refills | Status: AC

## 2023-05-25 NOTE — Telephone Encounter (Signed)
 Pt forgot to ask a question. She would like to know if she can start using the CeraVe face wash and clear aloa vera with the prescription that she received today. She is requesting a call back with advise.

## 2023-05-25 NOTE — Progress Notes (Signed)
   New Patient Visit   Subjective  Kaitlyn Franco is a 48 y.o. female who presents for the following: Rash of face and right wrist - started on wrist in December 2024 and face January 2025. It comes and goes and itches. She was was seen at Urgent Care 04/07/2023 and was given prednisone  which did not help. She has taken Benadryl and it helps some but she does not like the way it makes her feel. She does not have any pets and she works in Aeronautical engineer. She does remember picking up a cat and her kittens.  The following portions of the chart were reviewed this encounter and updated as appropriate: medications, allergies, medical history  Review of Systems:  No other skin or systemic complaints except as noted in HPI or Assessment and Plan.  Objective  Well appearing patient in no apparent distress; mood and affect are within normal limits.   A focused examination was performed of the following areas: Face, right arm   Relevant exam findings are noted in the Assessment and Plan.      Assessment & Plan   Tinea Faciei Exam: Annular pink scaly plaque with rolled borders on majority of face  Treatment Plan: Terbinafine 250 mg 1 tablet daily, Terbinafine 1% cream twice daily  The patient was counseled on the diagnosis of tinea faciei, a superficial fungal infection affecting the skin of the face. Clinical features include annular, erythematous, scaly plaques with central clearing and raised borders, which may be mildly pruritic. The condition is typically caused by dermatophyte species and can be exacerbated by topical steroids, which may temporarily suppress inflammation but worsen the underlying infection. The patient was advised to discontinue any topical steroid use and start antifungal therapy, either topical or systemic depending on the extent and severity of involvement. Proper hygiene and avoidance of shared personal items were discussed to prevent recurrence or spread. The importance of  treatment adherence and follow-up to monitor resolution or signs of recurrence was also emphasized. We will proceed with oral terbinafine  Anxiety regarding visit - Reassured patient that patient information is private and information will not be shared outside of her visit  Return in about 4 weeks (around 06/22/2023) for Follow up with [facility], [facility] with Kaitlyn Franco in the next couple months.  I, Kaitlyn Franco, CMA, am acting as scribe for Kaitlyn Finlay, MD .  We spent 45 min reviewing records, taking the patient history, providing face to face care with the patient, sending prescriptions.  Documentation: I have reviewed the above documentation for accuracy and completeness, and I agree with the above.  Kaitlyn Finlay, MD

## 2023-05-25 NOTE — Patient Instructions (Signed)

## 2023-06-02 ENCOUNTER — Ambulatory Visit: Payer: Self-pay | Admitting: Nurse Practitioner

## 2023-06-15 ENCOUNTER — Encounter: Payer: Self-pay | Admitting: General Practice

## 2023-06-22 ENCOUNTER — Encounter: Payer: Self-pay | Admitting: Dermatology

## 2023-06-22 ENCOUNTER — Ambulatory Visit: Admitting: Dermatology

## 2023-06-22 VITALS — BP 111/75 | HR 67

## 2023-06-22 DIAGNOSIS — B358 Other dermatophytoses: Secondary | ICD-10-CM

## 2023-06-22 DIAGNOSIS — W908XXA Exposure to other nonionizing radiation, initial encounter: Secondary | ICD-10-CM

## 2023-06-22 DIAGNOSIS — L810 Postinflammatory hyperpigmentation: Secondary | ICD-10-CM | POA: Diagnosis not present

## 2023-06-22 DIAGNOSIS — L578 Other skin changes due to chronic exposure to nonionizing radiation: Secondary | ICD-10-CM | POA: Diagnosis not present

## 2023-06-22 NOTE — Patient Instructions (Signed)
 Tinea Faciale   Important Information  Due to recent changes in healthcare laws, you may see results of your pathology and/or laboratory studies on MyChart before the doctors have had a chance to review them. We understand that in some cases there may be results that are confusing or concerning to you. Please understand that not all results are received at the same time and often the doctors may need to interpret multiple results in order to provide you with the best plan of care or course of treatment. Therefore, we ask that you please give us  2 business days to thoroughly review all your results before contacting the office for clarification. Should we see a critical lab result, you will be contacted sooner.   If You Need Anything After Your Visit  If you have any questions or concerns for your doctor, please call our main line at [phone] If no one answers, please leave a voicemail as directed and we will return your call as soon as possible. Messages left after 4 pm will be answered the following business day.   You may also send us  a message via MyChart. We typically respond to MyChart messages within 1-2 business days.  For prescription refills, please ask your pharmacy to contact our office. Our fax number is [phone].  If you have an urgent issue when the clinic is closed that cannot wait until the next business day, you can page your doctor at the number below.    Please note that while we do our best to be available for urgent issues outside of office hours, we are not available 24/7.   If you have an urgent issue and are unable to reach us , you may choose to seek medical care at your doctor's office, retail clinic, urgent care center, or emergency room.  If you have a medical emergency, please immediately call 911 or go to the emergency department. In the event of inclement weather, please call our main line at [phone] for an update on the status of any delays or  closures.  Dermatology Medication Tips: Please keep the boxes that topical medications come in in order to help keep track of the instructions about where and how to use these. Pharmacies typically print the medication instructions only on the boxes and not directly on the medication tubes.   If your medication is too expensive, please contact our office at [phone] or send us  a message through MyChart.   We are unable to tell what your co-pay for medications will be in advance as this is different depending on your insurance coverage. However, we may be able to find a substitute medication at lower cost or fill out paperwork to get insurance to cover a needed medication.   If a prior authorization is required to get your medication covered by your insurance company, please allow us  1-2 business days to complete this process.  Drug prices often vary depending on where the prescription is filled and some pharmacies may offer cheaper prices.  The website www.goodrx.com contains coupons for medications through different pharmacies. The prices here do not account for what the cost may be with help from insurance (it may be cheaper with your insurance), but the website can give you the price if you did not use any insurance.  - You can print the associated coupon and take it with your prescription to the pharmacy.  - You may also stop by our office during regular business hours and pick up a GoodRx coupon  card.  - If you need your prescription sent electronically to a different pharmacy, notify our office through [facility] or by phone at [phone]    Skin Education :   I counseled the patient regarding the following: Sun screen (SPF 30 or greater) should be applied during peak UV exposure (between 10am and 2pm) and reapplied after exercise or swimming.  The ABCDEs of melanoma were reviewed with the patient, and the importance of monthly self-examination of moles was emphasized.  Should any moles change in shape or color, or itch, bleed or burn, pt will contact our office for evaluation sooner then their interval appointment.  Plan: Sunscreen Recommendations I recommended a broad spectrum sunscreen with a SPF of 30 or higher. I explained that SPF 30 sunscreens block approximately 97 percent of the sun's harmful rays. Sunscreens should be applied at least 15 minutes prior to expected sun exposure and then every 2 hours after that as long as sun exposure continues. If swimming or exercising sunscreen should be reapplied every 45 minutes to an hour after getting wet or sweating. One ounce, or the equivalent of a shot glass full of sunscreen, is adequate to protect the skin not covered by a bathing suit. I also recommended a lip balm with a sunscreen as well. Sun protective clothing can be used in lieu of sunscreen but must be worn the entire time you are exposed to the sun's rays.

## 2023-06-22 NOTE — Progress Notes (Signed)
   Follow-Up Visit   Subjective  Kaitlyn Franco is a 48 y.o. female who presents for the following: here to follow up after treatment of Tinea Faciei, last seen on 05/25/2023. States that she has been using the terbinafine  250 mg tablets daily and topical 1% terbinafine  and feels like her rash has cleared. She states that she is no longer letting the cats sit close to her face.  The following portions of the chart were reviewed this encounter and updated as appropriate: medications, allergies, medical history  Review of Systems:  No other skin or systemic complaints except as noted in HPI or Assessment and Plan.  Objective  Well appearing patient in no apparent distress; mood and affect are within normal limits.    A focused examination was performed of the following areas: Face and arm  Relevant exam findings are noted in the Assessment and Plan.    Assessment & Plan   ACTINIC DAMAGE - chronic, secondary to cumulative UV radiation exposure/sun exposure over time - diffuse scaly erythematous macules with underlying dyspigmentation - Recommend daily broad spectrum sunscreen SPF 30+ to sun-exposed areas, reapply every 2 hours as needed.  - Recommend staying in the shade or wearing long sleeves, sun glasses (UVA+UVB protection) and wide brim hats (4-inch brim around the entire circumference of the hat). - Call for new or changing lesions.  POST-INFLAMMATORY HYPERPIGMENTATION (PIH) Exam: hyperpigmented macules and/or patches at face   This is a benign condition that comes from having previous inflammation in the skin and will fade with time over months to sometimes years. Recommend daily sun protection including sunscreen SPF 30+ to sun-exposed areas. - Recommend treating any itchy or red areas on the skin quickly to prevent new areas of PIH. Treating with prescription medicines such as hydroquinone may help fade dark spots faster.    Treatment Plan:  Strict sun protection.   Tinea  Faciale - responding well to oral terbinafine  250 mg and topical terbinafine  1% cream  - Recommend finish out terbinafine  oral course   TINEA FACIALE Head - Anterior (Face) Patient is responding well with oral terbinafine  250 mg 1 tablet daily and topical cream.  We recommended finishing all of the prescription and refill is finished.  We advised sunscreen on any sun exposed area.  Return if symptoms worsen or fail to improve.  I, Haig Levan, Surg Tech III, am acting as scribe for Deneise Finlay, MD.   Documentation: I have reviewed the above documentation for accuracy and completeness, and I agree with the above.  Deneise Finlay, MD

## 2023-06-23 ENCOUNTER — Ambulatory Visit (INDEPENDENT_AMBULATORY_CARE_PROVIDER_SITE_OTHER): Admitting: Family

## 2023-06-23 ENCOUNTER — Encounter: Payer: Self-pay | Admitting: Family

## 2023-06-23 VITALS — BP 110/64 | HR 58 | Temp 98.2°F | Resp 16 | Ht 62.0 in | Wt 130.0 lb

## 2023-06-23 DIAGNOSIS — Z124 Encounter for screening for malignant neoplasm of cervix: Secondary | ICD-10-CM

## 2023-06-23 DIAGNOSIS — Z1231 Encounter for screening mammogram for malignant neoplasm of breast: Secondary | ICD-10-CM

## 2023-06-23 DIAGNOSIS — Z0000 Encounter for general adult medical examination without abnormal findings: Secondary | ICD-10-CM | POA: Diagnosis not present

## 2023-06-23 DIAGNOSIS — Z13228 Encounter for screening for other metabolic disorders: Secondary | ICD-10-CM

## 2023-06-23 DIAGNOSIS — B001 Herpesviral vesicular dermatitis: Secondary | ICD-10-CM

## 2023-06-23 DIAGNOSIS — Z131 Encounter for screening for diabetes mellitus: Secondary | ICD-10-CM

## 2023-06-23 DIAGNOSIS — Z130 Encounter for screening for diseases of the blood and blood-forming organs and certain disorders involving the immune mechanism: Secondary | ICD-10-CM

## 2023-06-23 DIAGNOSIS — J358 Other chronic diseases of tonsils and adenoids: Secondary | ICD-10-CM | POA: Diagnosis not present

## 2023-06-23 DIAGNOSIS — Z7689 Persons encountering health services in other specified circumstances: Secondary | ICD-10-CM

## 2023-06-23 DIAGNOSIS — R635 Abnormal weight gain: Secondary | ICD-10-CM

## 2023-06-23 DIAGNOSIS — Z1211 Encounter for screening for malignant neoplasm of colon: Secondary | ICD-10-CM

## 2023-06-23 DIAGNOSIS — Z91419 Personal history of unspecified adult abuse: Secondary | ICD-10-CM

## 2023-06-23 DIAGNOSIS — Z13220 Encounter for screening for lipoid disorders: Secondary | ICD-10-CM

## 2023-06-23 DIAGNOSIS — Z1329 Encounter for screening for other suspected endocrine disorder: Secondary | ICD-10-CM

## 2023-06-23 NOTE — Progress Notes (Signed)
 Patient is here to established care with provider. ~health hx address ~care gaps address
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
## 2023-06-24 ENCOUNTER — Ambulatory Visit: Payer: Self-pay | Admitting: Family

## 2023-06-24 DIAGNOSIS — E785 Hyperlipidemia, unspecified: Secondary | ICD-10-CM

## 2023-06-24 DIAGNOSIS — Z130 Encounter for screening for diseases of the blood and blood-forming organs and certain disorders involving the immune mechanism: Secondary | ICD-10-CM

## 2023-06-24 DIAGNOSIS — Z1329 Encounter for screening for other suspected endocrine disorder: Secondary | ICD-10-CM

## 2023-06-24 LAB — CBC
Hematocrit: 41.8 % (ref 34.0–46.6)
Hemoglobin: 13.3 g/dL (ref 11.1–15.9)
MCH: 28.5 pg (ref 26.6–33.0)
MCHC: 31.8 g/dL (ref 31.5–35.7)
MCV: 90 fL (ref 79–97)
Platelets: 336 10*3/uL (ref 150–450)
RBC: 4.67 x10E6/uL (ref 3.77–5.28)
RDW: 12.7 % (ref 11.7–15.4)
WBC: 2.6 10*3/uL — ABNORMAL LOW (ref 3.4–10.8)

## 2023-06-24 LAB — CMP14+EGFR
ALT: 8 IU/L (ref 0–32)
AST: 13 IU/L (ref 0–40)
Albumin: 4.4 g/dL (ref 3.9–4.9)
Alkaline Phosphatase: 59 IU/L (ref 44–121)
BUN/Creatinine Ratio: 12 (ref 9–23)
BUN: 11 mg/dL (ref 6–24)
Bilirubin Total: 0.3 mg/dL (ref 0.0–1.2)
CO2: 20 mmol/L (ref 20–29)
Calcium: 9.6 mg/dL (ref 8.7–10.2)
Chloride: 107 mmol/L — ABNORMAL HIGH (ref 96–106)
Creatinine, Ser: 0.9 mg/dL (ref 0.57–1.00)
Globulin, Total: 2.7 g/dL (ref 1.5–4.5)
Glucose: 83 mg/dL (ref 70–99)
Potassium: 4.9 mmol/L (ref 3.5–5.2)
Sodium: 143 mmol/L (ref 134–144)
Total Protein: 7.1 g/dL (ref 6.0–8.5)
eGFR: 79 mL/min/{1.73_m2} (ref >59)

## 2023-06-24 LAB — LIPID PANEL
Chol/HDL Ratio: 5.6 ratio — ABNORMAL HIGH (ref 0.0–4.4)
Cholesterol, Total: 271 mg/dL — ABNORMAL HIGH (ref 100–199)
HDL: 48 mg/dL (ref >39)
LDL Chol Calc (NIH): 206 mg/dL — ABNORMAL HIGH (ref 0–99)
Triglycerides: 98 mg/dL (ref 0–149)
VLDL Cholesterol Cal: 17 mg/dL (ref 5–40)

## 2023-06-24 LAB — HEMOGLOBIN A1C
Est. average glucose Bld gHb Est-mCnc: 111 mg/dL
Hgb A1c MFr Bld: 5.5 % (ref 4.8–5.6)

## 2023-06-24 LAB — TSH: TSH: 4.63 u[IU]/mL — ABNORMAL HIGH (ref 0.450–4.500)

## 2023-06-24 MED ORDER — ATORVASTATIN CALCIUM 20 MG PO TABS: 20.0000 mg | ORAL_TABLET | Freq: Every day | ORAL | 0 refills | Status: DC

## 2023-07-14 ENCOUNTER — Ambulatory Visit: Admitting: Physician Assistant

## 2023-07-21 ENCOUNTER — Ambulatory Visit: Admitting: Family

## 2023-07-27 ENCOUNTER — Telehealth: Payer: Self-pay

## 2023-07-27 NOTE — Telephone Encounter (Signed)
 If she wants to schedule an appointment  to discuss with Kaitlyn Franco about the other test she can.  Patient stated she will call back later to schedule an appointment

## 2023-07-27 NOTE — Telephone Encounter (Signed)
 Copied from CRM [id number]. Topic: Clinical - Lab/Test Results >> Jul 27, 2023  8:52 AM Kaitlyn Franco wrote: Reason for CRM: Patient would like more information about her lab results. She would like to know if she has pre diabetes and menopause  Callback #: [id number]

## 2023-07-27 NOTE — Telephone Encounter (Signed)
 I called and spoke with patient and gave her lab results and made her aware if she want

## 2023-09-09 ENCOUNTER — Ambulatory Visit

## 2023-09-24 ENCOUNTER — Ambulatory Visit: Payer: Self-pay

## 2023-09-24 ENCOUNTER — Other Ambulatory Visit: Payer: Self-pay | Admitting: Otolaryngology

## 2023-09-24 DIAGNOSIS — K219 Gastro-esophageal reflux disease without esophagitis: Secondary | ICD-10-CM

## 2023-09-24 NOTE — Telephone Encounter (Signed)
 FYI Only or Action Required?: FYI only for provider.  Patient was last seen in primary care on 06/23/2023 by Lorren Greig PARAS, NP.  Called Nurse Triage reporting Elbow Pain.  Symptoms began about a month ago.  Interventions attempted: Rest, hydration, or home remedies.  Symptoms are: unchanged.  Triage Disposition: See PCP When Office is Open (Within 3 Days)  Patient/caregiver understands and will follow disposition?: Yes  Copied from CRM #[id number]. Topic: Clinical - Red Word Triage >> Sep 24, 2023  8:38 AM Selinda RAMAN wrote: Red Word that prompted transfer to Nurse Triage: The patient called in with right elbow pain that she has had for about a month and it is not getting better. She does have an appointment with her provider ina  month. I will transfer her to E2C2 NT  Reason for Disposition  [1] MODERATE pain (e.g., interferes with normal activities) AND [2] present > 3 days  Answer Assessment - Initial Assessment Questions 1. ONSET: When did the pain start?     Two months  2. LOCATION: Where is the pain located?     Right Side  3. PAIN: How bad is the pain? (Scale 1-10; or mild, moderate, severe)     Intermittent, 6  4. WORK OR EXERCISE: Has there been any recent work or exercise that involved this part of the body?      Denies  5. CAUSE: What do you think is causing the elbow pain?     Unsure  6. OTHER SYMPTOMS: Do you have any other symptoms? (e.g., neck pain, elbow swelling, rash, fever)     Denies  7. PREGNANCY: Is there any chance you are pregnant? When was your last menstrual period?     No  Protocols used: Elbow Pain-A-AH

## 2023-11-05 ENCOUNTER — Ambulatory Visit
Admission: RE | Admit: 2023-11-05 | Discharge: 2023-11-05 | Disposition: A | Discharge status: Home / Self Care | Source: Ambulatory Visit | Attending: Otolaryngology | Admitting: Otolaryngology

## 2023-11-05 DIAGNOSIS — K219 Gastro-esophageal reflux disease without esophagitis: Secondary | ICD-10-CM

## 2023-11-09 ENCOUNTER — Ambulatory Visit: Admitting: Family

## 2023-11-09 ENCOUNTER — Ambulatory Visit (INDEPENDENT_AMBULATORY_CARE_PROVIDER_SITE_OTHER)

## 2023-11-09 ENCOUNTER — Telehealth: Payer: Self-pay | Admitting: Family

## 2023-11-09 ENCOUNTER — Encounter: Payer: Self-pay | Admitting: Family

## 2023-11-09 VITALS — BP 122/72 | HR 75 | Temp 98.3°F | Resp 16 | Ht 62.0 in | Wt 128.4 lb

## 2023-11-09 DIAGNOSIS — E785 Hyperlipidemia, unspecified: Secondary | ICD-10-CM | POA: Diagnosis not present

## 2023-11-09 DIAGNOSIS — F419 Anxiety disorder, unspecified: Secondary | ICD-10-CM

## 2023-11-09 DIAGNOSIS — Z130 Encounter for screening for diseases of the blood and blood-forming organs and certain disorders involving the immune mechanism: Secondary | ICD-10-CM | POA: Diagnosis not present

## 2023-11-09 DIAGNOSIS — F32A Depression, unspecified: Secondary | ICD-10-CM

## 2023-11-09 DIAGNOSIS — Z1329 Encounter for screening for other suspected endocrine disorder: Secondary | ICD-10-CM

## 2023-11-09 DIAGNOSIS — M25521 Pain in right elbow: Secondary | ICD-10-CM

## 2023-11-09 MED ORDER — ATORVASTATIN CALCIUM 20 MG PO TABS: 20.0000 mg | ORAL_TABLET | Freq: Every day | ORAL | 0 refills | Status: DC

## 2023-11-09 NOTE — Progress Notes (Signed)
 Patient ID: Kaitlyn Franco, female    DOB: [birth year]  MRN: [id number]  CC: Elbow Pain  Subjective: Kaitlyn Franco is a 48 y.o. female who presents for elbow pain.   Her concerns today include:  - States intermittent right elbow pain persisting for months. Denies recent trauma/injury and red flag symptoms. States right elbow pain depends on position she moves. She is not taking over-the-counter medication to see if this helps. She would like xray of right elbow. She declines referral to Orthopedics.  - Doing well on Atorvastatin , no issues/concerns. - Anxiety depression. States she is established with therapist. She denies thoughts of self-harm, suicidal ideations, homicidal ideations. - Anemia lab. - Thyroid lab. - Patient states she is planning to have a dental procedure and will need medical clearance from Primary Care. Patient does not have form for provider review on today. I discussed with patient in detail to schedule an appointment for review of form for completion and workup for medical clearance. Patient verbalized understanding/agreement.  Patient Active Problem List   Diagnosis Date Noted   Left knee pain 09/26/2013   Midline low back pain without sciatica 09/26/2013   Herpes simplex 08/16/2013   Situational depression 08/16/2013     Current Outpatient Medications on File Prior to Visit  Medication Sig Dispense Refill   acyclovir  (ZOVIRAX ) 400 MG tablet TAKE 1 TABLET BY MOUTH 2 TIMES DAILY. (Patient not taking: Reported on 11/09/2023) 60 tablet 5   ipratropium (ATROVENT ) 0.06 % nasal spray Place 2 sprays into both nostrils 4 (four) times daily. (Patient not taking: Reported on 06/23/2023) 15 mL 12   terbinafine  (LAMISIL ) 1 % cream Apply 1 Application topically 2 (two) times daily. (Patient not taking: Reported on 11/09/2023) 42 g 2   terbinafine  (LAMISIL ) 250 MG tablet Take 1 tablet (250 mg total) by mouth daily. (Patient not taking: Reported on 11/09/2023) 30 tablet 1   No  current facility-administered medications on file prior to visit.    Allergies  Allergen Reactions   Mold Extract [Trichophyton]     Social History   Socioeconomic History   Marital status: Single    Spouse name: Not on file   Number of children: Not on file   Years of education: Not on file   Highest education level: Not on file  Occupational History   Not on file  Tobacco Use   Smoking status: Never   Smokeless tobacco: Never  Vaping Use   Vaping status: Never Used  Substance and Sexual Activity   Alcohol use: Not Currently   Drug use: Never   Sexual activity: Not Currently  Other Topics Concern   Not on file  Social History Narrative   Not on file   Social Drivers of Health   Financial Resource Strain: Low Risk  (11/09/2023)   Overall Financial Resource Strain (CARDIA)    Difficulty of Paying Living Expenses: Not hard at all  Food Insecurity: No Food Insecurity (11/09/2023)   Hunger Vital Sign    Worried About Running Out of Food in the Last Year: Never true    Ran Out of Food in the Last Year: Never true  Transportation Needs: No Transportation Needs (11/09/2023)   PRAPARE - Administrator, Civil Service (Medical): No    Lack of Transportation (Non-Medical): No  Physical Activity: Insufficiently Active (11/09/2023)   Exercise Vital Sign    Days of Exercise per Week: 7 days    Minutes of Exercise per Session: 20 min  Stress: Stress Concern Present (11/09/2023)   [employer] of Occupational Health - Occupational Stress Questionnaire    Feeling of Stress: Very much  Social Connections: Socially Isolated (11/09/2023)   Social Connection and Isolation Panel    Frequency of Communication with Friends and Family: Never    Frequency of Social Gatherings with Friends and Family: Never    Attends Religious Services: Never    Database Administrator or Organizations: No    Attends Banker Meetings: Never    Marital Status: Never married   Intimate Partner Violence: Not At Risk (11/09/2023)   Humiliation, Afraid, Rape, and Kick questionnaire    Fear of Current or Ex-Partner: No    Emotionally Abused: No    Physically Abused: No    Sexually Abused: No    Family History  Problem Relation Age of Onset   Alcohol abuse Father     History reviewed. No pertinent surgical history.  ROS: Review of Systems Negative except as stated above  PHYSICAL EXAM: BP 122/72   Pulse 75   Temp 98.3 F (36.8 C) (Oral)   Resp 16   Ht 5' 2 (1.575 m)   Wt 128 lb 6.4 oz (58.2 kg)   LMP 11/02/2023 (Approximate)   SpO2 98%   BMI 23.48 kg/m   Physical Exam HENT:     Head: Normocephalic and atraumatic.     Nose: Nose normal.     Mouth/Throat:     Mouth: Mucous membranes are moist.     Pharynx: Oropharynx is clear.  Eyes:     Extraocular Movements: Extraocular movements intact.     Conjunctiva/sclera: Conjunctivae normal.     Pupils: Pupils are equal, round, and reactive to light.  Cardiovascular:     Rate and Rhythm: Normal rate and regular rhythm.     Pulses: Normal pulses.     Heart sounds: Normal heart sounds.  Pulmonary:     Effort: Pulmonary effort is normal.     Breath sounds: Normal breath sounds.  Musculoskeletal:        General: Normal range of motion.     Right shoulder: Normal.     Left shoulder: Normal.     Right upper arm: Normal.     Left upper arm: Normal.     Right elbow: Normal.     Left elbow: Normal.     Right forearm: Normal.     Left forearm: Normal.     Right wrist: Normal.     Left wrist: Normal.     Right hand: Normal.     Left hand: Normal.     Cervical back: Normal range of motion and neck supple.  Neurological:     General: No focal deficit present.     Mental Status: She is alert and oriented to person, place, and time.  Psychiatric:        Mood and Affect: Mood normal.        Behavior: Behavior normal.     ASSESSMENT AND PLAN: 1. Right elbow pain (Primary) - Patient declined  pharmacological therapy.  - DG Elbow Complete Right for evaluation.  - Patient declined referral to Orthopedics.  - Follow-up with primary provider as scheduled.  - DG Elbow Complete Right; Future  2. Hyperlipidemia, unspecified hyperlipidemia type - Continue Atorvastatin  as prescribed. Counseled on medication adherence/adverse effects.  - Routine screening.  - Follow-up with primary provider as scheduled. - Lipid panel - atorvastatin  (LIPITOR) 20 MG tablet; Take 1 tablet (20  mg total) by mouth daily.  Dispense: 90 tablet; Refill: 0  3. Anxiety and depression - Patient denies thoughts of self-harm, suicidal ideations, homicidal ideations. - Patient declined pharmacological therapy.  - Patient declined referral to Psychiatry. - Keep all scheduled appointments with established therapist.  - Follow-up with primary provider as scheduled.  4. Screening for deficiency anemia - Routine screening.  - CBC  5. Thyroid disorder screen - Routine screening.  - TSH    Patient was given the opportunity to ask questions.  Patient verbalized understanding of the plan and was able to repeat key elements of the plan. Patient was given clear instructions to go to Emergency Department or return to medical center if symptoms don't improve, worsen, or new problems develop.The patient verbalized understanding.   Orders Placed This Encounter  Procedures   DG Elbow Complete Right   Lipid panel   CBC   TSH     Requested Prescriptions   Signed Prescriptions Disp Refills   atorvastatin  (LIPITOR) 20 MG tablet 90 tablet 0    Sig: Take 1 tablet (20 mg total) by mouth daily.    Return for Follow-up as needed.  Greig JINNY Drones, NP

## 2023-11-09 NOTE — Progress Notes (Signed)
 Right arm pain, patient scored a 17 on the GAD-7

## 2023-11-10 ENCOUNTER — Ambulatory Visit: Payer: Self-pay | Admitting: Family

## 2023-11-10 DIAGNOSIS — E785 Hyperlipidemia, unspecified: Secondary | ICD-10-CM

## 2023-11-10 LAB — LIPID PANEL
Chol/HDL Ratio: 5.3 ratio — ABNORMAL HIGH (ref 0.0–4.4)
Cholesterol, Total: 205 mg/dL — ABNORMAL HIGH (ref 100–199)
HDL: 39 mg/dL — ABNORMAL LOW (ref >39)
LDL Chol Calc (NIH): 127 mg/dL — ABNORMAL HIGH (ref 0–99)
Triglycerides: 217 mg/dL — ABNORMAL HIGH (ref 0–149)
VLDL Cholesterol Cal: 39 mg/dL (ref 5–40)

## 2023-11-10 LAB — CBC
Hematocrit: 45.7 % (ref 34.0–46.6)
Hemoglobin: 14.8 g/dL (ref 11.1–15.9)
MCH: 28.8 pg (ref 26.6–33.0)
MCHC: 32.4 g/dL (ref 31.5–35.7)
MCV: 89 fL (ref 79–97)
Platelets: 347 x10E3/uL (ref 150–450)
RBC: 5.13 x10E6/uL (ref 3.77–5.28)
RDW: 13.4 % (ref 11.7–15.4)
WBC: 7.1 x10E3/uL (ref 3.4–10.8)

## 2023-11-10 LAB — TSH: TSH: 2.29 u[IU]/mL (ref 0.450–4.500)

## 2023-11-10 MED ORDER — ATORVASTATIN CALCIUM 40 MG PO TABS: 40.0000 mg | ORAL_TABLET | Freq: Every day | ORAL | 0 refills | Status: AC

## 2023-11-12 NOTE — Telephone Encounter (Signed)
 I have call patient several times and her phone is not working

## 2023-11-13 NOTE — Telephone Encounter (Signed)
 Noted

## 2023-12-23 ENCOUNTER — Encounter: Admitting: Family

## 2023-12-23 ENCOUNTER — Telehealth: Payer: Self-pay | Admitting: Family

## 2023-12-23 NOTE — Progress Notes (Signed)
 Erroneous encounter-disregard

## 2023-12-23 NOTE — Telephone Encounter (Signed)
 Called patient about missed appt on 12/10. Number is not available .

## 2024-01-26 ENCOUNTER — Encounter: Payer: Self-pay | Admitting: Physician Assistant

## 2024-01-26 ENCOUNTER — Ambulatory Visit: Admitting: Physician Assistant

## 2024-01-26 VITALS — BP 123/85

## 2024-01-26 DIAGNOSIS — D1801 Hemangioma of skin and subcutaneous tissue: Secondary | ICD-10-CM | POA: Diagnosis not present

## 2024-01-26 DIAGNOSIS — L821 Other seborrheic keratosis: Secondary | ICD-10-CM

## 2024-01-26 DIAGNOSIS — L814 Other melanin hyperpigmentation: Secondary | ICD-10-CM | POA: Diagnosis not present

## 2024-01-26 DIAGNOSIS — D229 Melanocytic nevi, unspecified: Secondary | ICD-10-CM

## 2024-01-26 DIAGNOSIS — Z1283 Encounter for screening for malignant neoplasm of skin: Secondary | ICD-10-CM | POA: Diagnosis not present

## 2024-01-26 DIAGNOSIS — L578 Other skin changes due to chronic exposure to nonionizing radiation: Secondary | ICD-10-CM

## 2024-01-26 DIAGNOSIS — W908XXA Exposure to other nonionizing radiation, initial encounter: Secondary | ICD-10-CM

## 2024-01-26 NOTE — Progress Notes (Addendum)
" ° °  Follow-Up Visit   Subjective  Kaitlyn Franco is a 49 y.o. female EXISTING PATIENT who presents for the following: Skin Cancer Screening and Full Body Skin Exam - No history of skin cancer, no family history of skin cancer. She has 2 spots in her pubic area that she would like checked.  The patient presents for Total-Body Skin Exam (TBSE) for skin cancer screening and mole check. The patient has spots, moles and lesions to be evaluated, some may be new or changing and the patient may have concern these could be cancer.    The following portions of the chart were reviewed this encounter and updated as appropriate: medications, allergies, medical history  Review of Systems:  No other skin or systemic complaints except as noted in HPI or Assessment and Plan.  Objective  Well appearing patient in no apparent distress; mood and affect are within normal limits.  A full examination was performed including scalp, head, eyes, ears, nose, lips, neck, chest, axillae, abdomen, back, buttocks, bilateral upper extremities, bilateral lower extremities, hands, feet, fingers, toes, fingernails, and toenails. All findings within normal limits unless otherwise noted below.   Relevant physical exam findings are noted in the Assessment and Plan.    Assessment & Plan   SKIN CANCER SCREENING PERFORMED TODAY.  ACTINIC DAMAGE - Chronic condition, secondary to cumulative UV/sun exposure - diffuse scaly erythematous macules with underlying dyspigmentation - Recommend daily broad spectrum sunscreen SPF 30+ to sun-exposed areas, reapply every 2 hours as needed.  - Staying in the shade or wearing long sleeves, sun glasses (UVA+UVB protection) and wide brim hats (4-inch brim around the entire circumference of the hat) are also recommended for sun protection.  - Call for new or changing lesions.  LENTIGINES, SEBORRHEIC KERATOSES, HEMANGIOMAS - Benign normal skin lesions - Benign-appearing - Call for any  changes  MELANOCYTIC NEVI - Tan-brown and/or pink-flesh-colored symmetric macules and papules - Benign appearing on exam today - Observation - Call clinic for new or changing moles - Recommend daily use of broad spectrum spf 30+ sunscreen to sun-exposed areas.       ACTINIC SKIN DAMAGE   CHERRY ANGIOMA   LENTIGINES   MULTIPLE BENIGN NEVI   SEBORRHEIC KERATOSIS   SCREENING EXAM FOR SKIN CANCER   Return in about 1 year (around 01/25/2025) for TBSE.  I, Roseline Hutchinson, CMA, am acting as scribe for Yalena Colon K, PA-C .   Documentation: I have reviewed the above documentation for accuracy and completeness, and I agree with the above.  Conall Vangorder K, PA-C    "

## 2024-01-26 NOTE — Patient Instructions (Signed)

## 2024-01-31 NOTE — Addendum Note (Signed)
 Addended by: ORMAN AMERICA on: 01/31/2024 06:58 PM   Modules accepted: Level of Service

## 2024-02-09 ENCOUNTER — Ambulatory Visit: Admitting: Physician Assistant

## 2025-01-25 ENCOUNTER — Ambulatory Visit: Admitting: Physician Assistant
